# Patient Record
Sex: Male | Born: 1960 | Hispanic: No | Marital: Married | Smoking: Never smoker
Health system: Southern US, Community
[De-identification: ages and names within clinical notes are randomized; demographics above are authoritative.]

---

## 2003-05-24 ENCOUNTER — Emergency Department (HOSPITAL_COMMUNITY): Admission: EM | Admit: 2003-05-24 | Discharge: 2003-05-24 | Payer: Self-pay | Admitting: Emergency Medicine

## 2010-06-21 ENCOUNTER — Emergency Department (HOSPITAL_COMMUNITY): Admission: EM | Admit: 2010-06-21 | Discharge: 2010-06-21 | Payer: Self-pay | Admitting: Emergency Medicine

## 2011-02-02 LAB — CBC
MCV: 89.8 fL (ref 78.0–100.0)
Platelets: 265 10*3/uL (ref 150–400)

## 2011-02-02 LAB — DIFFERENTIAL
Basophils Relative: 0 % (ref 0–1)
Eosinophils Absolute: 0 10*3/uL (ref 0.0–0.7)
Monocytes Relative: 10 % (ref 3–12)
Neutro Abs: 1.7 10*3/uL (ref 1.7–7.7)
Neutrophils Relative %: 47 % (ref 43–77)

## 2011-02-02 LAB — POCT I-STAT, CHEM 8
Calcium, Ion: 1.21 mmol/L (ref 1.12–1.32)
Creatinine, Ser: 0.9 mg/dL (ref 0.4–1.5)
Glucose, Bld: 114 mg/dL — ABNORMAL HIGH (ref 70–99)
Hemoglobin: 15.6 g/dL (ref 13.0–17.0)
Potassium: 3.9 mEq/L (ref 3.5–5.1)

## 2011-02-02 LAB — POCT CARDIAC MARKERS: Myoglobin, poc: 42.3 ng/mL (ref 12–200)

## 2013-05-05 ENCOUNTER — Emergency Department (HOSPITAL_COMMUNITY)
Admission: EM | Admit: 2013-05-05 | Discharge: 2013-05-05 | Disposition: A | Discharge status: Home / Self Care | Payer: BC Managed Care – PPO | Attending: Emergency Medicine | Admitting: Emergency Medicine

## 2013-05-05 ENCOUNTER — Emergency Department (HOSPITAL_COMMUNITY): Payer: BC Managed Care – PPO

## 2013-05-05 ENCOUNTER — Encounter (HOSPITAL_COMMUNITY): Payer: Self-pay | Admitting: *Deleted

## 2013-05-05 DIAGNOSIS — R079 Chest pain, unspecified: Secondary | ICD-10-CM

## 2013-05-05 DIAGNOSIS — R0789 Other chest pain: Secondary | ICD-10-CM | POA: Insufficient documentation

## 2013-05-05 DIAGNOSIS — R42 Dizziness and giddiness: Secondary | ICD-10-CM | POA: Insufficient documentation

## 2013-05-05 DIAGNOSIS — R61 Generalized hyperhidrosis: Secondary | ICD-10-CM | POA: Insufficient documentation

## 2013-05-05 DIAGNOSIS — R0989 Other specified symptoms and signs involving the circulatory and respiratory systems: Secondary | ICD-10-CM | POA: Insufficient documentation

## 2013-05-05 DIAGNOSIS — Z7982 Long term (current) use of aspirin: Secondary | ICD-10-CM | POA: Insufficient documentation

## 2013-05-05 DIAGNOSIS — R0609 Other forms of dyspnea: Secondary | ICD-10-CM | POA: Insufficient documentation

## 2013-05-05 LAB — CBC WITH DIFFERENTIAL/PLATELET
Basophils Relative: 0 % (ref 0–1)
Lymphocytes Relative: 53 % — ABNORMAL HIGH (ref 12–46)
Lymphs Abs: 2 10*3/uL (ref 0.7–4.0)
MCHC: 35 g/dL (ref 30.0–36.0)
Monocytes Relative: 12 % (ref 3–12)
Platelets: 232 10*3/uL (ref 150–400)
RDW: 12.3 % (ref 11.5–15.5)

## 2013-05-05 LAB — COMPREHENSIVE METABOLIC PANEL
ALT: 22 U/L (ref 0–53)
Albumin: 3.9 g/dL (ref 3.5–5.2)
CO2: 29 mEq/L (ref 19–32)
Calcium: 9.5 mg/dL (ref 8.4–10.5)
Sodium: 137 mEq/L (ref 135–145)
Total Bilirubin: 0.3 mg/dL (ref 0.3–1.2)
Total Protein: 7.2 g/dL (ref 6.0–8.3)

## 2013-05-05 LAB — POCT I-STAT TROPONIN I: Troponin i, poc: 0.01 ng/mL (ref 0.00–0.08)

## 2013-05-05 NOTE — ED Notes (Signed)
States having chest pain all day worse now; left sided pain; little bit of shortness of breath

## 2013-05-05 NOTE — ED Provider Notes (Signed)
History     CSN: [id number]  Arrival date & time 05/05/13  2057   First MD Initiated Contact with Patient 05/05/13 2109      Chief Complaint  Patient presents with  . Chest Pain    (Consider location/radiation/quality/duration/timing/severity/associated sxs/prior treatment) Patient is a 52 y.o. male presenting with chest pain. The history is provided by the patient.  Chest Pain  Patient here complaining of a one-day history of intermittent chest pain lasting minutes to hours located in left chest radiating to his left arm. Symptoms have not been associated with dyspnea, diaphoresis, dizziness. No exertional component to it. Had similar symptoms yesterday which resolved. Has been taking aspirin which do seem to help. Denies any fever or cough. No rashes to his chest. No prior history of same. Denies any history of coronary artery disease. Recent trauma history or leg pain or swelling. He is currently pain-free at this time History reviewed. No pertinent past medical history.  History reviewed. No pertinent past surgical history.  No family history on file.  History  Substance Use Topics  . Smoking status: Never Smoker   . Smokeless tobacco: Not on file  . Alcohol Use: No      Review of Systems  Cardiovascular: Positive for chest pain.  All other systems reviewed and are negative.    Allergies  Review of patient's allergies indicates no known allergies.  Home Medications   Current Outpatient Rx  Name  Route  Sig  Dispense  Refill  . aspirin EC 81 MG tablet   Oral   Take 81 mg by mouth daily.           BP 125/74  Pulse 88  Temp(Src) 98.8 F (37.1 C) (Oral)  Resp 18  SpO2 95%  Physical Exam  Nursing note and vitals reviewed. Constitutional: He is oriented to person, place, and time. He appears well-developed and well-nourished.  Non-toxic appearance. No distress.  HENT:  Head: Normocephalic and atraumatic.  Eyes: Conjunctivae, EOM and lids are normal.  Pupils are equal, round, and reactive to light.  Neck: Normal range of motion. Neck supple. No tracheal deviation present. No mass present.  Cardiovascular: Normal rate, regular rhythm and normal heart sounds.  Exam reveals no gallop.   No murmur heard. Pulmonary/Chest: Effort normal and breath sounds normal. No stridor. No respiratory distress. He has no decreased breath sounds. He has no wheezes. He has no rhonchi. He has no rales.  Abdominal: Soft. Normal appearance and bowel sounds are normal. He exhibits no distension. There is no tenderness. There is no rebound and no CVA tenderness.  Musculoskeletal: Normal range of motion. He exhibits no edema and no tenderness.  Neurological: He is alert and oriented to person, place, and time. He has normal strength. No cranial nerve deficit or sensory deficit. GCS eye subscore is 4. GCS verbal subscore is 5. GCS motor subscore is 6.  Skin: Skin is warm and dry. No abrasion and no rash noted.  Psychiatric: He has a normal mood and affect. His speech is normal and behavior is normal.    ED Course  Procedures (including critical care time)  Labs Reviewed  CBC WITH DIFFERENTIAL  COMPREHENSIVE METABOLIC PANEL   No results found.   No diagnosis found.    MDM   Date: 05/05/2013  Rate: 89  Rhythm: normal sinus rhythm  QRS Axis: normal  Intervals: normal  ST/T Wave abnormalities: normal  Conduction Disutrbances:none  Narrative Interpretation:   Old EKG Reviewed: none available  10:41 PM Patient offered admission for evaluation of his chest pain. He has deferred at this time and the risk of sudden cardiac death was explained to him and he accepts this. He was instructed to continue taking aspirin every day and will be given cardiology referral and was encouraged to return if he changes his mind        Toy Baker, MD 05/05/13 2242

## 2013-05-06 ENCOUNTER — Other Ambulatory Visit (HOSPITAL_COMMUNITY): Payer: Self-pay | Admitting: Cardiology

## 2013-05-06 DIAGNOSIS — R079 Chest pain, unspecified: Secondary | ICD-10-CM

## 2013-05-12 ENCOUNTER — Ambulatory Visit (HOSPITAL_COMMUNITY)
Admission: RE | Admit: 2013-05-12 | Discharge: 2013-05-12 | Disposition: A | Discharge status: Home / Self Care | Payer: BC Managed Care – PPO | Source: Ambulatory Visit | Attending: Cardiology | Admitting: Cardiology

## 2013-05-12 ENCOUNTER — Encounter (HOSPITAL_COMMUNITY)
Admission: RE | Disposition: A | Discharge status: Home / Self Care | Payer: Self-pay | Source: Ambulatory Visit | Attending: Cardiology

## 2013-05-12 DIAGNOSIS — Z7982 Long term (current) use of aspirin: Secondary | ICD-10-CM | POA: Insufficient documentation

## 2013-05-12 DIAGNOSIS — E7800 Pure hypercholesterolemia, unspecified: Secondary | ICD-10-CM | POA: Insufficient documentation

## 2013-05-12 DIAGNOSIS — Z8249 Family history of ischemic heart disease and other diseases of the circulatory system: Secondary | ICD-10-CM | POA: Insufficient documentation

## 2013-05-12 DIAGNOSIS — R079 Chest pain, unspecified: Secondary | ICD-10-CM | POA: Insufficient documentation

## 2013-05-12 DIAGNOSIS — Z79899 Other long term (current) drug therapy: Secondary | ICD-10-CM | POA: Insufficient documentation

## 2013-05-12 HISTORY — PX: LEFT HEART CATHETERIZATION WITH CORONARY ANGIOGRAM: SHX5451

## 2013-05-12 SURGERY — LEFT HEART CATHETERIZATION WITH CORONARY ANGIOGRAM
Anesthesia: LOCAL

## 2013-05-12 MED ORDER — SODIUM CHLORIDE 0.9 % IJ SOLN: 3.0000 mL | Freq: Two times a day (BID) | INTRAMUSCULAR | Status: DC

## 2013-05-12 MED ORDER — LIDOCAINE HCL (PF) 1 % IJ SOLN
INTRAMUSCULAR | Status: AC
  Filled 2013-05-12: qty 30

## 2013-05-12 MED ORDER — NITROGLYCERIN 0.2 MG/ML ON CALL CATH LAB
INTRAVENOUS | Status: AC
  Filled 2013-05-12: qty 1

## 2013-05-12 MED ORDER — SODIUM CHLORIDE 0.9 % IV SOLN
INTRAVENOUS | Status: DC
  Administered 2013-05-12: 09:00:00 via INTRAVENOUS

## 2013-05-12 MED ORDER — ASPIRIN 81 MG PO CHEW
CHEWABLE_TABLET | ORAL | Status: AC
  Administered 2013-05-12: 324 mg via ORAL
  Filled 2013-05-12: qty 4

## 2013-05-12 MED ORDER — ASPIRIN 81 MG PO CHEW
324.0000 mg | CHEWABLE_TABLET | ORAL | Status: AC
  Administered 2013-05-12: 324 mg via ORAL

## 2013-05-12 MED ORDER — FENTANYL CITRATE 0.05 MG/ML IJ SOLN
INTRAMUSCULAR | Status: AC
  Filled 2013-05-12: qty 2

## 2013-05-12 MED ORDER — SODIUM CHLORIDE 0.9 % IV SOLN: 250.0000 mL | INTRAVENOUS | Status: DC | PRN

## 2013-05-12 MED ORDER — SODIUM CHLORIDE 0.9 % IV SOLN: INTRAVENOUS | Status: AC

## 2013-05-12 MED ORDER — ACETAMINOPHEN 325 MG PO TABS: 650.0000 mg | ORAL_TABLET | ORAL | Status: DC | PRN

## 2013-05-12 MED ORDER — SODIUM CHLORIDE 0.9 % IJ SOLN: 3.0000 mL | INTRAMUSCULAR | Status: DC | PRN

## 2013-05-12 MED ORDER — MIDAZOLAM HCL 2 MG/2ML IJ SOLN
INTRAMUSCULAR | Status: AC
  Filled 2013-05-12: qty 2

## 2013-05-12 MED ORDER — HEPARIN (PORCINE) IN NACL 2-0.9 UNIT/ML-% IJ SOLN
INTRAMUSCULAR | Status: AC
  Filled 2013-05-12: qty 1000

## 2013-05-12 MED ORDER — ONDANSETRON HCL 4 MG/2ML IJ SOLN: 4.0000 mg | Freq: Four times a day (QID) | INTRAMUSCULAR | Status: DC | PRN

## 2013-05-12 NOTE — H&P (Signed)
H&P in the chart needs to be scanned 

## 2013-05-12 NOTE — Cardiovascular Report (Signed)
NAMESHAMARION, COOTS NO.:  [id number]  MEDICAL RECORD NO.:  [id number]  LOCATION:  [facility]                         FACILITY:  [facility]  PHYSICIAN:  Therin Vetsch N. Sharyn Lull, M.D. DATE OF BIRTH:  16-Oct-1961  DATE OF PROCEDURE:  05/12/2013 DATE OF DISCHARGE:                           CARDIAC CATHETERIZATION   PROCEDURE:  Left cardiac cath with selective left and right coronary angiography, LV graphy via right groin using Judkins technique.  INDICATION FOR THE PROCEDURE:  Howell is a 52 year old male with no significant past medical history, complains of retrosternal and left- sided chest pain described as pressure, squeezing, radiating to left shoulder and left arm associated with exertion, relieves with rest.  The patient denies any nausea, vomiting, diaphoresis.  Denies palpitation, lightheadedness, or syncope.  Denies PND, orthopnea, or leg swelling. Denies relation of chest pain to food, breathing, or movement.  Denies rest or nocturnal angina.  The patient was seen at [facility], workup was negative, and referred for further evaluation and management. Discussed with the patient at length regarding noninvasive stress testing versus left cath, possible PTCA stenting.  Initially, the patient agreed for stress testing, but subsequently refused to go for stress testing and wanted to proceed with invasive left cath possible PTCA stenting.  The patient also has a significant family history of coronary artery disease.  Father died in his 73s due to massive MI and so due to typical anginal chest pain and strong family history of heart disease, the patient was consented for invasive procedure as above.  PROCEDURE:  After obtaining the informed consent, the patient was brought to the cath lab and was placed on fluoroscopy table.  Right groin was prepped and draped in usual fashion.  1% Xylocaine was used for local anesthesia in the right groin.  With the help of thin  wall needle, 5-French arterial sheath was placed.  The sheath was aspirated and flushed.  Next, 5-French left Judkins catheter was advanced over the wire under fluoroscopic guidance up to the ascending aorta.  Wire was pulled out, the catheter was aspirated and connected to the Manifold. Catheter was further advanced and engaged into the left coronary ostium. Multiple views of the left system were taken.  Next, catheter was disengaged and was pulled out over the wire and was replaced with 5- [place] right Judkins catheter, which was advanced over the wire under fluoroscopic guidance up to the ascending aorta.  Wire was pulled out, the catheter was aspirated and connected to the Manifold.  Catheter was further advanced and engaged into right coronary ostium.  Multiple views of the right system were obtained.  Next, catheter was disengaged and was pulled out over the wire and was replaced with 5-French pigtail catheter, which was advanced over the wire under fluoroscopic guidance up to the ascending aorta.  Catheter was further advanced across the aortic valve into the LV.  LV pressures were recorded.  Next, LV graft was done in 30-degree RAO position.  Post-angiographic pressures were recorded from LV and then pullback pressures were recorded from the aorta.  There was minimal gradient across the aortic valve.  Next, the pigtail catheter was pulled out over the wire.  Sheaths were aspirated and flushed.  FINDINGS:  LV showed good LV systolic function.  EF of 55%-60%.  Left main was short, which was patent.  LAD was patent.  Diagonal 1 was small, which was patent.  Diagonal 2 was very small, which was patent. Ramus was very very small.  Left circumflex 5%-10% mid stenosis.  OM1 and OM2 were patent.  RCA was patent.  PDA and PLV branches were patent.  The patient tolerated the procedure well.  There were no complications.  The patient was transferred to recovery room in stable  condition.     Eduardo Osier. Sharyn Lull, M.D.     [facility]/MEDQ  D:  05/12/2013  T:  05/12/2013  Job:  [id number]

## 2013-05-12 NOTE — CV Procedure (Signed)
Left cardiac cath report dictated on 05/12/2013 dictation number is [phone]

## 2013-05-13 ENCOUNTER — Encounter (HOSPITAL_COMMUNITY): Payer: BC Managed Care – PPO

## 2014-10-28 ENCOUNTER — Encounter (HOSPITAL_COMMUNITY): Payer: Self-pay | Admitting: Cardiology

## 2015-06-19 ENCOUNTER — Encounter (HOSPITAL_COMMUNITY): Payer: Self-pay | Admitting: Emergency Medicine

## 2015-06-19 ENCOUNTER — Emergency Department (HOSPITAL_COMMUNITY): Payer: BC Managed Care – PPO

## 2015-06-19 ENCOUNTER — Emergency Department (HOSPITAL_COMMUNITY)
Admission: EM | Admit: 2015-06-19 | Discharge: 2015-06-19 | Disposition: A | Discharge status: Home / Self Care | Payer: BC Managed Care – PPO | Attending: Emergency Medicine | Admitting: Emergency Medicine

## 2015-06-19 DIAGNOSIS — R1031 Right lower quadrant pain: Secondary | ICD-10-CM

## 2015-06-19 DIAGNOSIS — Z9889 Other specified postprocedural states: Secondary | ICD-10-CM | POA: Diagnosis not present

## 2015-06-19 DIAGNOSIS — Z7982 Long term (current) use of aspirin: Secondary | ICD-10-CM | POA: Insufficient documentation

## 2015-06-19 DIAGNOSIS — K5732 Diverticulitis of large intestine without perforation or abscess without bleeding: Secondary | ICD-10-CM | POA: Insufficient documentation

## 2015-06-19 DIAGNOSIS — R1030 Lower abdominal pain, unspecified: Secondary | ICD-10-CM | POA: Diagnosis present

## 2015-06-19 LAB — URINALYSIS, ROUTINE W REFLEX MICROSCOPIC
BILIRUBIN URINE: NEGATIVE
GLUCOSE, UA: NEGATIVE mg/dL
HGB URINE DIPSTICK: NEGATIVE
Ketones, ur: NEGATIVE mg/dL
Leukocytes, UA: NEGATIVE
Nitrite: NEGATIVE
PROTEIN: NEGATIVE mg/dL
Specific Gravity, Urine: 1.017 (ref 1.005–1.030)
UROBILINOGEN UA: 0.2 mg/dL (ref 0.0–1.0)
pH: 7 (ref 5.0–8.0)

## 2015-06-19 LAB — CBC WITH DIFFERENTIAL/PLATELET
BASOS ABS: 0 10*3/uL (ref 0.0–0.1)
Basophils Relative: 0 % (ref 0–1)
EOS ABS: 0 10*3/uL (ref 0.0–0.7)
EOS PCT: 0 % (ref 0–5)
HCT: 43.6 % (ref 39.0–52.0)
Hemoglobin: 15.4 g/dL (ref 13.0–17.0)
LYMPHS PCT: 17 % (ref 12–46)
Lymphs Abs: 1.3 10*3/uL (ref 0.7–4.0)
MCH: 30.2 pg (ref 26.0–34.0)
MCHC: 35.3 g/dL (ref 30.0–36.0)
MCV: 85.5 fL (ref 78.0–100.0)
Monocytes Absolute: 0.8 10*3/uL (ref 0.1–1.0)
Monocytes Relative: 10 % (ref 3–12)
NEUTROS PCT: 73 % (ref 43–77)
Neutro Abs: 5.6 10*3/uL (ref 1.7–7.7)
PLATELETS: 221 10*3/uL (ref 150–400)
RBC: 5.1 MIL/uL (ref 4.22–5.81)
RDW: 12.5 % (ref 11.5–15.5)
WBC: 7.6 10*3/uL (ref 4.0–10.5)

## 2015-06-19 LAB — BASIC METABOLIC PANEL
ANION GAP: 10 (ref 5–15)
BUN: 10 mg/dL (ref 6–20)
CHLORIDE: 103 mmol/L (ref 101–111)
CO2: 26 mmol/L (ref 22–32)
CREATININE: 1.01 mg/dL (ref 0.61–1.24)
Calcium: 9 mg/dL (ref 8.9–10.3)
GFR calc Af Amer: >60 mL/min (ref >60)
GFR calc non Af Amer: >60 mL/min (ref >60)
Glucose, Bld: 111 mg/dL — ABNORMAL HIGH (ref 65–99)
Potassium: 3.8 mmol/L (ref 3.5–5.1)
Sodium: 139 mmol/L (ref 135–145)

## 2015-06-19 MED ORDER — HYDROCODONE-ACETAMINOPHEN 5-325 MG PO TABS: 2.0000 | ORAL_TABLET | ORAL | Status: DC | PRN

## 2015-06-19 MED ORDER — AMOXICILLIN-POT CLAVULANATE 875-125 MG PO TABS: 1.0000 | ORAL_TABLET | Freq: Two times a day (BID) | ORAL | Status: DC

## 2015-06-19 MED ORDER — IOHEXOL 300 MG/ML  SOLN
100.0000 mL | Freq: Once | INTRAMUSCULAR | Status: AC | PRN
  Administered 2015-06-19: 100 mL via INTRAVENOUS

## 2015-06-19 MED ORDER — IOHEXOL 300 MG/ML  SOLN: 50.0000 mL | Freq: Once | INTRAMUSCULAR | Status: DC | PRN

## 2015-06-19 NOTE — Discharge Instructions (Signed)

## 2015-06-19 NOTE — ED Notes (Signed)
Pt aware urine sample is needed.  Drinking po contrast.

## 2015-06-19 NOTE — ED Provider Notes (Signed)
CSN: [id number]     Arrival date & time 06/19/15  1001 History   First MD Initiated Contact with Patient 06/19/15 1016     Chief Complaint  Patient presents with  . Abdominal Pain  . Constipation     HPI Patient presents with lower abdominal pain began Friday.  Denies nausea vomiting or diarrhea.  Denies urgency frequency.  Denies fever chills.  Patient had a colonoscopy done a week or so ago. History reviewed. No pertinent past medical history. Past Surgical History  Procedure Laterality Date  . Left heart catheterization with coronary angiogram N/A 05/12/2013    Procedure: LEFT HEART CATHETERIZATION WITH CORONARY ANGIOGRAM;  Surgeon: Robynn Pane, MD;  Location: [facility] CATH LAB;  Service: Cardiovascular;  Laterality: N/A;   History reviewed. No pertinent family history. History  Substance Use Topics  . Smoking status: Never Smoker   . Smokeless tobacco: Not on file  . Alcohol Use: No    Review of Systems    Allergies  Review of patient's allergies indicates no known allergies.  Home Medications   Prior to Admission medications   Medication Sig Start Date End Date Taking? Authorizing Provider  aspirin EC 81 MG tablet Take 81 mg by mouth daily.   Yes Historical Provider, MD  amoxicillin-clavulanate (AUGMENTIN) 875-125 MG per tablet Take 1 tablet by mouth every 12 (twelve) hours. 06/19/15   Nelva Nay, MD  HYDROcodone-acetaminophen (NORCO/VICODIN) 5-325 MG per tablet Take 2 tablets by mouth every 4 (four) hours as needed. 06/19/15   Nelva Nay, MD   BP 116/74 mmHg  Pulse 85  Temp(Src) 97.2 F (36.2 C) (Oral)  Resp 16  SpO2 98% Physical Exam Physical Exam  Nursing note and vitals reviewed. Constitutional: He is oriented to person, place, and time. He appears well-developed and well-nourished. No distress.  HENT:  Head: Normocephalic and atraumatic.  Eyes: Pupils are equal, round, and reactive to light.  Neck: Normal range of motion.  Cardiovascular: Normal rate  and intact distal pulses.   Pulmonary/Chest: No respiratory distress.  Abdominal: Normal appearance. He exhibits no distension.  Tenderness to palpation in the right lower quadrant and left lower quadrant.  No rebound or guarding tenderness.  Bowel sounds are active.  Small umbilical hernia which is easily reducible noted.   Musculoskeletal: Normal range of motion.  Neurological: He is alert and oriented to person, place, and time. No cranial nerve deficit.  Skin: Skin is warm and dry. No rash noted.  Psychiatric: He has a normal mood and affect. His behavior is normal.   ED Course  Procedures (including critical care time) Labs Review Labs Reviewed  BASIC METABOLIC PANEL - Abnormal; Notable for the following:    Glucose, Bld 111 (*)    All other components within normal limits  CBC WITH DIFFERENTIAL/PLATELET  URINALYSIS, ROUTINE W REFLEX MICROSCOPIC (NOT AT [facility])    Imaging Review Ct Abdomen Pelvis W Contrast  06/19/2015   CLINICAL DATA:  Right lower medial abdominal pain since Friday.  EXAM: CT ABDOMEN AND PELVIS WITH CONTRAST  TECHNIQUE: Multidetector CT imaging of the abdomen and pelvis was performed using the standard protocol following bolus administration of intravenous contrast.  CONTRAST:  OMNIPAQUE IOHEXOL 300 MG/ML  SOLN  COMPARISON:  None.  FINDINGS: Scattered colonic diverticulosis with a short segment (approximately 10 cm) of geographic wall thickening involving the sigmoid colon within the left lower abdomen / upper pelvis with associated adjacent mesenteric stranding (representative axial image 57, series 2; coronal image 34,  series 4) favored to represent area of acute uncomplicated diverticulitis. There is a very small amount of free fluid seen within the caudal aspect of the right pericolic gutter (image 58, series 2), favored to be reactive. No evidence of perforation or definable/drainable fluid collection.  Enteric contrast extends to the level of the hepatic flexure  of the colon. The bowel is otherwise normal in course and caliber without wall thickening. Normal noncontrast appearance of the retrocecal appendix. No pneumoperitoneum, pneumatosis or portal venous gas.  Normal hepatic contour. Punctate sub cm hypo attenuating lesion within the dome of the right lobe of the liver (image 5, series 2), is too small likely characterize of favored to represent a hepatic cyst. Normal appearance of the gallbladder. No radiopaque gallstones. No intra extrahepatic biliary dilatation. No ascites.  There is symmetric enhancement and excretion of the bilateral kidneys. Note is made of an approximately 2 cm hypo attenuating nonenhancing left-sided renal cyst (image 16, series 3). No discrete right-sided renal lesions. No definite renal stones on this postcontrast examination. No urinary obstruction or perinephric stranding. Normal appearance of the bilateral adrenal glands, pancreas and spleen.  Normal caliber of the abdominal aorta. The major branch vessels of the abdominal aorta appear widely patent on this non CTA examination.  No bulky retroperitoneal, mesenteric, pelvic or inguinal lymphadenopathy.  Normal appearance of the urinary bladder given degree distention. Normal appearance of the pelvic organs. A prominent phleboliths is seen within the right lower hemipelvis. No free fluid in the pelvic cul-de-sac.  Limited visualization of the lower thorax demonstrates minimal dependent subpleural ground-glass atelectasis. No discrete focal airspace opacities. No pleural effusion.  Normal heart size.  No pericardial effusion.  No acute or aggressive osseous abnormalities. Note is made of partial lumbarization of the S1 vertebral body.  Small mesenteric fat containing periumbilical and bilateral inguinal hernias. Regional soft tissues appear otherwise normal.    IMPRESSION: Examination is positive for acute uncomplicated diverticulitis involving an approximately 10 cm length segment of the  sigmoid colon within the left lower abdomen/upper pelvis. No evidence of perforation or definable/drainable fluid collection. No evidence of enteric obstruction. If not recently performed, further evaluation with colonoscopy after the resolution of acute symptoms is recommended to exclude the presence of an underlying mass.     Electronically Signed   By: Simonne Come M.D.   On: 06/19/2015 12:20      MDM   Final diagnoses:  RLQ abdominal pain  Diverticulitis of large intestine without perforation or abscess without bleeding        Nelva Nay, MD 06/19/15 1344

## 2015-06-19 NOTE — ED Notes (Signed)
Pt c/o lower medial abdominal pain onset Friday, worsened last night. Denies nausea, emesis, diarrhea, or urinary symptoms.

## 2015-06-19 NOTE — ED Notes (Signed)
Pt given discharge instructions, verbalized understanding

## 2016-11-23 ENCOUNTER — Encounter (HOSPITAL_COMMUNITY): Payer: Self-pay

## 2016-11-23 DIAGNOSIS — Z7982 Long term (current) use of aspirin: Secondary | ICD-10-CM | POA: Insufficient documentation

## 2016-11-23 DIAGNOSIS — R197 Diarrhea, unspecified: Secondary | ICD-10-CM | POA: Insufficient documentation

## 2016-11-23 DIAGNOSIS — Z79899 Other long term (current) drug therapy: Secondary | ICD-10-CM | POA: Insufficient documentation

## 2016-11-23 DIAGNOSIS — R112 Nausea with vomiting, unspecified: Secondary | ICD-10-CM | POA: Diagnosis present

## 2016-11-23 LAB — COMPREHENSIVE METABOLIC PANEL
ALBUMIN: 3.9 g/dL (ref 3.5–5.0)
ALK PHOS: 70 U/L (ref 38–126)
ALT: 26 U/L (ref 17–63)
AST: 26 U/L (ref 15–41)
Anion gap: 8 (ref 5–15)
BILIRUBIN TOTAL: 0.8 mg/dL (ref 0.3–1.2)
BUN: 13 mg/dL (ref 6–20)
CALCIUM: 8.6 mg/dL — AB (ref 8.9–10.3)
CO2: 25 mmol/L (ref 22–32)
Chloride: 102 mmol/L (ref 101–111)
Creatinine, Ser: 0.83 mg/dL (ref 0.61–1.24)
GFR calc Af Amer: >60 mL/min (ref >60)
GFR calc non Af Amer: >60 mL/min (ref >60)
GLUCOSE: 119 mg/dL — AB (ref 65–99)
Potassium: 3.5 mmol/L (ref 3.5–5.1)
SODIUM: 135 mmol/L (ref 135–145)
TOTAL PROTEIN: 7.2 g/dL (ref 6.5–8.1)

## 2016-11-23 LAB — URINALYSIS, ROUTINE W REFLEX MICROSCOPIC
BILIRUBIN URINE: NEGATIVE
GLUCOSE, UA: NEGATIVE mg/dL
HGB URINE DIPSTICK: NEGATIVE
KETONES UR: NEGATIVE mg/dL
Leukocytes, UA: NEGATIVE
NITRITE: NEGATIVE
Protein, ur: 30 mg/dL — AB
SPECIFIC GRAVITY, URINE: 1.026 (ref 1.005–1.030)
Squamous Epithelial / LPF: NONE SEEN
pH: 5 (ref 5.0–8.0)

## 2016-11-23 LAB — CBC
HCT: 43.6 % (ref 39.0–52.0)
HEMOGLOBIN: 15.2 g/dL (ref 13.0–17.0)
MCH: 30 pg (ref 26.0–34.0)
MCHC: 34.9 g/dL (ref 30.0–36.0)
MCV: 86.2 fL (ref 78.0–100.0)
Platelets: 190 10*3/uL (ref 150–400)
RBC: 5.06 MIL/uL (ref 4.22–5.81)
RDW: 12.7 % (ref 11.5–15.5)
WBC: 3 10*3/uL — ABNORMAL LOW (ref 4.0–10.5)

## 2016-11-23 LAB — LIPASE, BLOOD: Lipase: 18 U/L (ref 11–51)

## 2016-11-23 NOTE — ED Triage Notes (Signed)
Pt states that he thinks he may have food poisoning. He is complaining of N/V/D/F starting last night. He states that he recently ate some meat that he think caused it. Denies other sickness in the home. A&Ox4. Ambulatory.

## 2016-11-24 ENCOUNTER — Emergency Department (HOSPITAL_COMMUNITY)
Admission: EM | Admit: 2016-11-24 | Discharge: 2016-11-24 | Disposition: A | Discharge status: Home / Self Care | Payer: BC Managed Care – PPO | Attending: Emergency Medicine | Admitting: Emergency Medicine

## 2016-11-24 DIAGNOSIS — R197 Diarrhea, unspecified: Secondary | ICD-10-CM

## 2016-11-24 DIAGNOSIS — R112 Nausea with vomiting, unspecified: Secondary | ICD-10-CM

## 2016-11-24 MED ORDER — ONDANSETRON HCL 4 MG PO TABS: 4.0000 mg | ORAL_TABLET | Freq: Four times a day (QID) | ORAL | 0 refills | Status: DC

## 2016-11-24 MED ORDER — ONDANSETRON 4 MG PO TBDP
4.0000 mg | ORAL_TABLET | Freq: Once | ORAL | Status: AC | PRN
  Administered 2016-11-24: 4 mg via ORAL
  Filled 2016-11-24: qty 1

## 2016-11-24 MED ORDER — DIPHENOXYLATE-ATROPINE 2.5-0.025 MG PO TABS: 1.0000 | ORAL_TABLET | Freq: Four times a day (QID) | ORAL | 0 refills | Status: DC | PRN

## 2016-11-24 NOTE — ED Provider Notes (Signed)
[facility]-EMERGENCY DEPT Provider Note   CSN: [id number] Arrival date & time: 11/23/16  2203   By signing my name below, I, Clovis Pu, attest that this documentation has been prepared under the direction and in the presence of Gilda Crease, MD  Electronically Signed: Clovis Pu, ED Scribe. 11/24/16. 2:23 AM.   History   Chief Complaint Chief Complaint  Patient presents with  . Nausea  . Emesis  . Diarrhea    The history is provided by the patient. No language interpreter was used.   HPI Comments:  Glenn Kirby is a 56 y.o. male who presents to the Emergency Department complaining of sudden onset, gradually improving nausea, intermittent vomiting and intermittent diarrhea x 2 night ago. Pt also reports associated, mild abdominal discomfort and chills. Pt was given Zofran in the ED with relief. Pt denies cough, cold-like symptoms, any other associated symptoms and any other modifying factors at this time.    No past medical history on file.  There are no active problems to display for this patient.   Past Surgical History:  Procedure Laterality Date  . LEFT HEART CATHETERIZATION WITH CORONARY ANGIOGRAM N/A 05/12/2013   Procedure: LEFT HEART CATHETERIZATION WITH CORONARY ANGIOGRAM;  Surgeon: Robynn Pane, MD;  Location[facility] C[facility] LAB;  Service: Cardiovascular;  Laterality: N/A;    Home Medications    Prior to Admission medications   Medication Sig Start Date End Date Taking? Authorizing Provider  amoxicillin-clavulanate (AUGMENTIN) 875-125 MG per tablet Take 1 tablet by mouth every 12 (twelve) hours. 06/19/15   Nelva Nay, MD  aspirin EC 81 MG tablet Take 81 mg by mouth daily.    Historical Provider, MD  diphenoxylate-atropine (LOMOTIL) 2.5-0.025 MG tablet Take 1 tablet by mouth 4 (four) times daily as needed for diarrhea or loose stools. 11/24/16   Gilda Crease, MD  HYDROcodone-acetaminophen (NORCO/VICODIN) 5-325 MG per tablet Take 2 tablets by mouth every  4 (four) hours as needed. 06/19/15   Nelva Nay, MD  ondansetron (ZOFRAN) 4 MG tablet Take 1 tablet (4 mg total) by mouth every 6 (six) hours. 11/24/16   Gilda Crease, MD    Family History No family history on file.  Social History Social History  Substance Use Topics  . Smoking status: Never Smoker  . Smokeless tobacco: Never Used  . Alcohol use No     Allergies   Patient has no known allergies.   Review of Systems Review of Systems  Constitutional: Positive for chills.  Respiratory: Negative for cough.   Gastrointestinal: Positive for abdominal pain, diarrhea, nausea and vomiting.  All other systems reviewed and are negative.  Physical Exam Updated Vital Signs BP 134/87 (BP Location: Right Arm)   Pulse 101   Temp 98.5 F (36.9 C) (Oral)   Resp 20   Ht 5\' 8"  (1.727 m)   Wt 185 lb (83.9 kg)   SpO2 94%   BMI 28.13 kg/m   Physical Exam  Constitutional: He is oriented to person, place, and time. He appears well-developed and well-nourished. No distress.  HENT:  Head: Normocephalic and atraumatic.  Right Ear: Hearing normal.  Left Ear: Hearing normal.  Nose: Nose normal.  Mouth/Throat: Oropharynx is clear and moist and mucous membranes are normal.  Eyes: Conjunctivae and EOM are normal. Pupils are equal, round, and reactive to light.  Neck: Normal range of motion. Neck supple.  Cardiovascular: Regular rhythm, S1 normal and S2 normal.  Exam reveals no gallop and no friction rub.  No murmur heard. Pulmonary/Chest: Effort normal and breath sounds normal. No respiratory distress. He exhibits no tenderness.  Abdominal: Soft. Normal appearance and bowel sounds are normal. There is no hepatosplenomegaly. There is no tenderness. There is no rebound, no guarding, no tenderness at McBurney's point and negative Murphy's sign. No hernia.  Musculoskeletal: Normal range of motion.  Neurological: He is alert and oriented to person, place, and time. He has normal  strength. No cranial nerve deficit or sensory deficit. Coordination normal. GCS eye subscore is 4. GCS verbal subscore is 5. GCS motor subscore is 6.  Skin: Skin is warm, dry and intact. No rash noted. No cyanosis.  Psychiatric: He has a normal mood and affect. His speech is normal and behavior is normal. Thought content normal.  Nursing note and vitals reviewed.    ED Treatments / Results  DIAGNOSTIC STUDIES:  Oxygen Saturation is 94% on RA, normal by my interpretation.    COORDINATION OF CARE:  2:22 AM Discussed treatment plan with pt at bedside and pt agreed to plan.  Labs (all labs ordered are listed, but only abnormal results are displayed) Labs Reviewed  COMPREHENSIVE METABOLIC PANEL - Abnormal; Notable for the following:       Result Value   Glucose, Bld 119 (*)    Calcium 8.6 (*)    All other components within normal limits  CBC - Abnormal; Notable for the following:    WBC 3.0 (*)    All other components within normal limits  URINALYSIS, ROUTINE W REFLEX MICROSCOPIC - Abnormal; Notable for the following:    APPearance HAZY (*)    Protein, ur 30 (*)    Bacteria, UA RARE (*)    All other components within normal limits  LIPASE, BLOOD    EKG  EKG Interpretation None       Radiology No results found.  Procedures Procedures (including critical care time)  Medications Ordered in ED Medications  ondansetron (ZOFRAN-ODT) disintegrating tablet 4 mg (4 mg Oral Given 11/24/16 0216)     Initial Impression / Assessment and Plan / ED Course  I have reviewed the triage vital signs and the nursing notes.  Pertinent labs & imaging results that were available during my care of the patient were reviewed by me and considered in my medical decision making (see chart for details).  Clinical Course    Patient presents to the emergency department for evaluation of nausea, vomiting, diarrhea. Symptoms present for 1 day. Patient reports that he is starting to feel better. He  was given Zofran ODT here in the ER and is now tolerating oral intake. He had abdominal cramping earlier, however, abdominal exam is entirely unremarkable currently. He has no tenderness to palpation in all 4 quadrants. He has normal bowel sounds present. Lab Work is unremarkable. As examination is normal, workup is normal and he is feeling better, will discharge with symptomatically treatment.  Final Clinical Impressions(s) / ED Diagnoses   Final diagnoses:  Nausea vomiting and diarrhea    New Prescriptions New Prescriptions   DIPHENOXYLATE-ATROPINE (LOMOTIL) 2.5-0.025 MG TABLET    Take 1 tablet by mouth 4 (four) times daily as needed for diarrhea or loose stools.   ONDANSETRON (ZOFRAN) 4 MG TABLET    Take 1 tablet (4 mg total) by mouth every 6 (six) hours.  I personally performed the services described in this documentation, which was scribed in my presence. The recorded information has been reviewed and is accurate.     Gilda Crease,  MD 11/24/16 [id number]

## 2018-10-31 ENCOUNTER — Ambulatory Visit: Payer: BC Managed Care – PPO | Admitting: Family Medicine

## 2018-10-31 ENCOUNTER — Encounter: Payer: Self-pay | Admitting: Family Medicine

## 2018-10-31 DIAGNOSIS — F439 Reaction to severe stress, unspecified: Secondary | ICD-10-CM | POA: Diagnosis not present

## 2018-10-31 MED ORDER — DOXEPIN HCL 6 MG PO TABS: ORAL_TABLET | ORAL | 2 refills | Status: DC

## 2018-10-31 NOTE — Assessment & Plan Note (Signed)
Secondary to adjustment reaction due to recent passing of his longtime pet.  He is seeing psychiatry and is on Tranxene which seems to be helping.  He has had some difficulty sleeping in addition to his anxiety symptoms.  Will start low-dose doxepin 6 mg at night per his request.  Discussed potential side effects of this medication.  He will follow-up with me in 4 to 6 weeks.

## 2018-10-31 NOTE — Patient Instructions (Addendum)
It was very nice to see you today!  We will start doxepin 6mg  nightly. Please let me know if you have any side effects.  Come back to see me in 4-6 weeks for your physical with blood work, or sooner as needed.   Take care, Dr Jimmey RalphParker

## 2018-10-31 NOTE — Progress Notes (Signed)
Subjective:  Glenn Kirby is a 57 y.o. male who presents today with a chief complaint of stress and to establish care.  HPI:  Stress/anxiety Patient has had intermittent episodes of depression and anxiety in the past several years.  Significantly worsened a week ago when his cat of 10 years passed away.  He has had some difficulty sleeping.  Has occasionally been tearful.  He saw psychiatrist couple days ago and was started on Tranxene.  This is worked well however still has significant symptoms that are bothersome to him.  He has never been on any other medications in the past.  He has difficulty sleeping.  No other obvious alleviating or aggravating factors.  Depression screen PHQ 2/9 10/31/2018  Decreased Interest 1  Down, Depressed, Hopeless 1  PHQ - 2 Score 2  Altered sleeping 1  Tired, decreased energy 1  Change in appetite 1  Feeling bad or failure about yourself  0  Trouble concentrating 1  Moving slowly or fidgety/restless 0  Suicidal thoughts 0  PHQ-9 Score 6  Difficult doing work/chores Somewhat difficult    GAD 7 : Generalized Anxiety Score 10/31/2018  Nervous, Anxious, on Edge 1  Control/stop worrying 1  Worry too much - different things 1  Trouble relaxing 0  Restless 1  Easily annoyed or irritable 1  Afraid - awful might happen 1  Total GAD 7 Score 6  Anxiety Difficulty Somewhat difficult   ROS: Per HPI, otherwise a complete review of systems was negative.   PMH:  The following were reviewed and entered/updated in epic: History reviewed. No pertinent past medical history. Patient Active Problem List   Diagnosis Date Noted  . Stress 10/31/2018   Past Surgical History:  Procedure Laterality Date  . LEFT HEART CATHETERIZATION WITH CORONARY ANGIOGRAM N/A 05/12/2013   Procedure: LEFT HEART CATHETERIZATION WITH CORONARY ANGIOGRAM;  Surgeon: Robynn PaneMohan N Harwani, MD;  [facility];  Service: Cardiovascular;  Laterality: N/A;    Family History    Problem Relation Age of Onset  . Cancer Neg Hx     Medications- reviewed and updated Current Outpatient Medications  Medication Sig Dispense Refill  . clorazepate (TRANXENE) 3.75 MG tablet Take 3.75 mg by mouth 2 (two) times daily as needed for anxiety.    . Doxepin HCl 6 MG TABS Take 1 tab 30 minutes before bed. 30 tablet 2   No current facility-administered medications for this visit.     Allergies-reviewed and updated No Known Allergies  Social History   Socioeconomic History  . Marital status: Married    Spouse name: Not on file  . Number of children: Not on file  . Years of education: Not on file  . Highest education level: Not on file  Occupational History  . Not on file  Social Needs  . Financial resource strain: Not on file  . Food insecurity:    Worry: Not on file    Inability: Not on file  . Transportation needs:    Medical: Not on file    Non-medical: Not on file  Tobacco Use  . Smoking status: Never Smoker  . Smokeless tobacco: Never Used  Substance and Sexual Activity  . Alcohol use: No    Frequency: Never  . Drug use: Never  . Sexual activity: Not on file  Lifestyle  . Physical activity:    Days per week: Not on file    Minutes per session: Not on file  . Stress: Not on file  Relationships  . Social connections:    Talks on phone: Not on file    Gets together: Not on file    Attends religious service: Not on file    Active member of club or organization: Not on file    Attends meetings of clubs or organizations: Not on file    Relationship status: Not on file  Other Topics Concern  . Not on file  Social History Narrative  . Not on file     Objective:  Physical Exam: BP 120/76 (BP Location: Left Arm, Patient Position: Sitting, Cuff Size: Normal)   Pulse 76   Temp 98.5 F (36.9 C) (Oral)   Ht 5\' 8"  (1.727 m)   Wt 189 lb 12 oz (86.1 kg)   SpO2 96%   BMI 28.85 kg/m   Gen: NAD, resting comfortably CV: RRR with no murmurs  appreciated Pulm: NWOB, CTAB with no crackles, wheezes, or rhonchi GI: Normal bowel sounds present. Soft, Nontender, Nondistended. MSK: No edema, cyanosis, or clubbing noted Skin: Warm, dry Neuro: Grossly normal, moves all extremities Psych: Normal affect and thought content  Assessment/Plan:  Stress Secondary to adjustment reaction due to recent passing of his longtime pet.  He is seeing psychiatry and is on Tranxene which seems to be helping.  He has had some difficulty sleeping in addition to his anxiety symptoms.  Will start low-dose doxepin 6 mg at night per his request.  Discussed potential side effects of this medication.  He will follow-up with me in 4 to 6 weeks.  Preventative Healthcare Patient was instructed to return soon for CPE.  Obtain records from previous PCP. Health Maintenance Due  Topic Date Due  . Hepatitis C Screening  1961/04/21  . HIV Screening  09/07/1975  . TETANUS/TDAP  09/07/1979  . COLONOSCOPY  09/06/2010   Glenn Degree. [name], MD 10/31/2018 3:42 PM

## 2018-11-18 ENCOUNTER — Ambulatory Visit: Payer: BC Managed Care – PPO | Admitting: Sports Medicine

## 2018-11-18 ENCOUNTER — Encounter: Payer: Self-pay | Admitting: Sports Medicine

## 2018-11-18 VITALS — BP 126/72 | HR 93 | Ht 68.0 in | Wt 193.2 lb

## 2018-11-18 DIAGNOSIS — M25532 Pain in left wrist: Secondary | ICD-10-CM

## 2018-11-18 MED ORDER — DICLOFENAC SODIUM 2 % TD SOLN: 1.0000 "application " | Freq: Two times a day (BID) | TRANSDERMAL | 0 refills | Status: AC

## 2018-11-18 MED ORDER — DICLOFENAC SODIUM 2 % TD SOLN: 1.0000 "application " | Freq: Two times a day (BID) | TRANSDERMAL | 2 refills | Status: DC

## 2018-11-18 NOTE — Patient Instructions (Signed)
Pennsaid instructions: You have been given a sample/prescription for Pennsaid, a topical medication.     You are to apply this gel to your injured body part twice daily (morning and evening).   A little goes a long way so you can use about a pea-sized amount for each area.   Spread this small amount over the area into a thin film and let it dry.   Be sure that you do not rub the gel into your skin for more than 10 or 15 seconds otherwise it can irritate you skin.    Once you apply the gel, please do not put any other lotion or clothing in contact with that area for 30 minutes to allow the gel to absorb into your skin.   Some people are sensitive to the medication and can develop a sunburn-like rash.  If you have only mild symptoms it is okay to continue to use the medication but if you have any breakdown of your skin you should discontinue its use and please let us know.   If you have been written a prescription for Pennsaid, you will receive a pump bottle of this topical gel through a mail order pharmacy.  The instructions on the bottle will say to apply two pumps twice a day which may be too much gel for your particular area so use the pea-sized amount as your guide.  

## 2018-11-18 NOTE — Progress Notes (Signed)
Glenn FellsMichael D. Delorise Shinerigby, Glenn Kirby  [facility] [phone]  Glenn Kirby - 57 y.o. male MRN [id number]  Date of birth: 1961/06/23  Visit Date:   PCP: Ardith DarkParker, Caleb M, MD   Referred by: Ardith DarkParker, Caleb M, MD   SUBJECTIVE:  Chief Complaint  Patient presents with  . New Patient (Initial Visit)    L wrist pain    HPI: Patient presents with 2 days of worsening left dorsal wrist pain that is described as moderate and sharp.  He has a small amount of intermittent mild swelling.  It is helped with ice as well as slightly with Aspercreme and Aleve.  He denies any known trauma.  He denies any known exacerbating activities.  REVIEW OF SYSTEMS: Denies night time disturbances. Denies fevers, chills, or night sweats. Denies unexplained weight loss. Denies personal history of cancer. Denies changes in bowel or bladder habits. Denies recent unreported falls. Denies new or worsening dyspnea or wheezing. Denies headaches or dizziness.  Denies numbness, tingling or weakness  In the extremities.  Denies dizziness or presyncopal episodes Denies lower extremity edema   HISTORY:  Prior history reviewed and updated per electronic medical record.  Social History   Occupational History  . Not on file  Tobacco Use  . Smoking status: Never Smoker  . Smokeless tobacco: Never Used  Substance and Sexual Activity  . Alcohol use: No    Frequency: Never  . Drug use: Never  . Sexual activity: Not on file   Social History   Social History Narrative  . Not on file    No past medical history on file.  Past Surgical History:  Procedure Laterality Date  . LEFT HEART CATHETERIZATION WITH CORONARY ANGIOGRAM N/A 05/12/2013   Procedure: LEFT HEART CATHETERIZATION WITH CORONARY ANGIOGRAM;  Surgeon: Robynn PaneMohan N Harwani, MD;  [facility];  Service: Cardiovascular;  Laterality: N/A;    family history is negative for Cancer.  DATA OBTAINED & REVIEWED:  No  results for input(s): HGBA1C, LABURIC, CREATINE, CALCIUM, AST, ALT, TSH in the last 8760 hours.  Invalid input(s): MAGNESIUM, CK No problems updated. No specialty comments available.  OBJECTIVE:  VS:  HT:5\' 8"  (172.7 cm)   WT:193 lb 3.2 oz (87.6 kg)  BMI:29.38    BP:126/72  HR:93bpm  TEMP: ( )  RESP:95 %   PHYSICAL EXAM: CONSTITUTIONAL: Well-developed, Well-nourished and In no acute distress PSYCHIATRIC : Alert & appropriately interactive. and Not depressed or anxious appearing. RESPIRATORY : No increased work of breathing and Trachea Midline EYES : Pupils are equal., EOM intact without nystagmus. and No scleral icterus.  VASCULAR EXAM : Warm and well perfused NEURO: unremarkable  MSK Exam:  Left wrist  Well aligned No significant deformity. No overlying skin changes TTP over: Scapholunate ligament with a small amount of laxity with scapholunate testing.  This is mild and improves with a cavitation produced with testing. Mild swelling No effusion No synovitis He has good grip strength.  Slightly limited wrist extension with approximately 5 degrees flexion contracture of the elbow as well it does improve with passive range of motion.  He is guarding the wrist slightly.  He has normal ulnar deviation and radial deviation without pain.    ASSESSMENT  1. Arthralgia of left wrist     PLAN:  Pertinent additional documentation may be included in corresponding procedure notes, imaging studies, problem based documentation and patient instructions.  Procedures:  None  Medications:  Meds ordered this encounter  Medications  . Diclofenac Sodium (PENNSAID) 2 % SOLN    Sig: Place 1 application onto the skin 2 (two) times daily.    Dispense:  112 g    Refill:  2    Home Phone      [phone] Mobile          [phone]   . Diclofenac Sodium (PENNSAID) 2 % SOLN    Sig: Place 1 application onto the skin 2 (two) times daily for 1 day.    Dispense:  8 g    Refill:  0     Discussion/Instructions: No problem-specific Assessment & Plan notes found for this encounter. RICE (Rest, ICE, Compression, Elevation) principles reviewed with the patient. Discussed red flag symptoms that warrant earlier emergent evaluation and patient voices understanding. Activity modifications and the importance of avoiding exacerbating activities (limiting pain to no more than a 4 / 10 during or following activity) recommended and discussed.   Ultimately this should Glenn Kirby well with conservative measures including prescription and samples for Pennsaid.  We did discuss a small amount of synovitis can cause some persistent symptoms and if any lack of improvement further diagnostic evaluation with clenched fist wrist x-rays will be recommended.    Return if symptoms worsen or fail to improve.          Andrena MewsMichael D Rigby, Glenn Kirby    [facility] Physician

## 2018-11-25 ENCOUNTER — Ambulatory Visit: Payer: BC Managed Care – PPO | Admitting: Family Medicine

## 2018-11-25 DIAGNOSIS — Z0289 Encounter for other administrative examinations: Secondary | ICD-10-CM

## 2018-11-26 ENCOUNTER — Encounter: Payer: Self-pay | Admitting: Family Medicine

## 2018-12-16 ENCOUNTER — Encounter: Payer: Self-pay | Admitting: Physician Assistant

## 2018-12-16 ENCOUNTER — Ambulatory Visit: Payer: BC Managed Care – PPO | Admitting: Physician Assistant

## 2018-12-16 VITALS — BP 110/70 | HR 98 | Temp 98.4°F | Ht 68.0 in | Wt 191.4 lb

## 2018-12-16 DIAGNOSIS — J069 Acute upper respiratory infection, unspecified: Secondary | ICD-10-CM | POA: Diagnosis not present

## 2018-12-16 MED ORDER — BENZONATATE 100 MG PO CAPS: 100.0000 mg | ORAL_CAPSULE | Freq: Three times a day (TID) | ORAL | 1 refills | Status: DC | PRN

## 2018-12-16 NOTE — Progress Notes (Signed)
Glenn Kirby is a 58 y.o. male here for a new problem.  I acted as a Neurosurgeonscribe for Energy East CorporationSamantha Cameshia Cressman, PA-C Glenn Mullonna Orphanos, LPN  History of Present Illness:   Chief Complaint  Patient presents with  . Cough    Cough  This is a new problem. Episode onset: Started on Saturday. The problem has been gradually worsening. The problem occurs constantly. The cough is productive of sputum (yellow sputum). Associated symptoms include headaches, nasal congestion and postnasal drip. Pertinent negatives include no chills, ear pain, fever, sore throat, shortness of breath or wheezing. Associated symptoms comments: Chest congestion. The symptoms are aggravated by lying down. Treatments tried: Aleve. There is no history of asthma, bronchitis or pneumonia.    History reviewed. No pertinent past medical history.   Social History   Socioeconomic History  . Marital status: Married    Spouse name: Not on file  . Number of children: Not on file  . Years of education: Not on file  . Highest education level: Not on file  Occupational History  . Not on file  Social Needs  . Financial resource strain: Not on file  . Food insecurity:    Worry: Not on file    Inability: Not on file  . Transportation needs:    Medical: Not on file    Non-medical: Not on file  Tobacco Use  . Smoking status: Never Smoker  . Smokeless tobacco: Never Used  Substance and Sexual Activity  . Alcohol use: No    Frequency: Never  . Drug use: Never  . Sexual activity: Not on file  Lifestyle  . Physical activity:    Days per week: Not on file    Minutes per session: Not on file  . Stress: Not on file  Relationships  . Social connections:    Talks on phone: Not on file    Gets together: Not on file    Attends religious service: Not on file    Active member of club or organization: Not on file    Attends meetings of clubs or organizations: Not on file    Relationship status: Not on file  . Intimate partner violence:   Fear of current or ex partner: Not on file    Emotionally abused: Not on file    Physically abused: Not on file    Forced sexual activity: Not on file  Other Topics Concern  . Not on file  Social History Narrative  . Not on file    Past Surgical History:  Procedure Laterality Date  . LEFT HEART CATHETERIZATION WITH CORONARY ANGIOGRAM N/A 05/12/2013   Procedure: LEFT HEART CATHETERIZATION WITH CORONARY ANGIOGRAM;  Surgeon: Robynn PaneMohan N Harwani, MD;  [facility];  Service: Cardiovascular;  Laterality: N/A;    Family History  Problem Relation Age of Onset  . Cancer Neg Hx     No Known Allergies  Current Medications:   Current Outpatient Medications:  .  aspirin EC 81 MG tablet, Take by mouth., Disp: , Rfl:  .  clorazepate (TRANXENE) 3.75 MG tablet, Take 3.75 mg by mouth 2 (two) times daily as needed for anxiety., Disp: , Rfl:  .  Diclofenac Sodium (PENNSAID) 2 % SOLN, Place 1 application onto the skin 2 (two) times daily., Disp: 112 g, Rfl: 2 .  Doxepin HCl 6 MG TABS, Take 1 tab 30 minutes before bed., Disp: 30 tablet, Rfl: 2 .  benzonatate (TESSALON) 100 MG capsule, Take 1 capsule (100 mg total) by mouth  3 (three) times daily as needed for cough., Disp: 20 capsule, Rfl: 1   Review of Systems:   Review of Systems  Constitutional: Negative for chills and fever.  HENT: Positive for postnasal drip. Negative for ear pain and sore throat.   Respiratory: Positive for cough. Negative for shortness of breath and wheezing.   Neurological: Positive for headaches.    Vitals:   Vitals:   12/16/18 0919  BP: 110/70  Pulse: 98  Temp: 98.4 F (36.9 C)  TempSrc: Oral  SpO2: 95%  Weight: 191 lb 6.1 oz (86.8 kg)  Height: 5\' 8"  (1.727 m)     Body mass index is 29.1 kg/m.  Physical Exam:   Physical Exam Vitals signs and nursing note reviewed.  Constitutional:      General: He is not in acute distress.    Appearance: He is well-developed. He is not ill-appearing or  toxic-appearing.  HENT:     Head: Normocephalic and atraumatic.     Right Ear: Tympanic membrane, ear canal and external ear normal. Tympanic membrane is not erythematous, retracted or bulging.     Left Ear: Tympanic membrane, ear canal and external ear normal. Tympanic membrane is not erythematous, retracted or bulging.     Nose: Mucosal edema, congestion and rhinorrhea present.     Right Sinus: No maxillary sinus tenderness or frontal sinus tenderness.     Left Sinus: No maxillary sinus tenderness or frontal sinus tenderness.     Mouth/Throat:     Lips: Pink.     Mouth: Mucous membranes are moist.     Pharynx: Uvula midline. Posterior oropharyngeal erythema present.     Tonsils: Swelling: 0 on the right. 0 on the left.  Eyes:     General: Lids are normal.     Conjunctiva/sclera: Conjunctivae normal.  Neck:     Trachea: Trachea normal.  Cardiovascular:     Rate and Rhythm: Normal rate and regular rhythm.     Pulses: Normal pulses.     Heart sounds: Normal heart sounds, S1 normal and S2 normal.     Comments: No LE edema Pulmonary:     Effort: Pulmonary effort is normal.     Breath sounds: Normal breath sounds. No decreased breath sounds, wheezing, rhonchi or rales.  Lymphadenopathy:     Cervical: No cervical adenopathy.  Skin:    General: Skin is warm and dry.  Neurological:     Mental Status: He is alert.     GCS: GCS eye subscore is 4. GCS verbal subscore is 5. GCS motor subscore is 6.  Psychiatric:        Speech: Speech normal.        Behavior: Behavior normal. Behavior is cooperative.     Assessment and Plan:   Glenn Kirby was seen today for cough.  Diagnoses and all orders for this visit:  Upper respiratory tract infection, unspecified type  Other orders -     benzonatate (TESSALON) 100 MG capsule; Take 1 capsule (100 mg total) by mouth 3 (three) times daily as needed for cough.   No red flags on exam.  Suspect viral etiology -- will not prescribed abx at this time.  Will initiate supportive care -- tessalon perles, mucinex DM. Discussed taking medications as prescribed. Reviewed return precautions including worsening fever, SOB, worsening cough or other concerns. Push fluids and rest. I recommend that patient follow-up if symptoms worsen or persist despite treatment x 7-10 days, sooner if needed.  . Reviewed expectations re: course  of current medical issues. . Discussed self-management of symptoms. . Outlined signs and symptoms indicating need for more acute intervention. . Patient verbalized understanding and all questions were answered. . See orders for this visit as documented in the electronic medical record. . Patient received an After-Visit Summary.  CMA or LPN served as scribe during this visit. History, Physical, and Plan performed by medical provider. The above documentation has been reviewed and is accurate and complete.  Jarold Motto, PA-C

## 2018-12-16 NOTE — Patient Instructions (Signed)
It was great to see you!  You have a viral upper respiratory infection. Antibiotics are not needed for this.  Viral infections usually take 7-10 days to resolve.  The cough can last a few weeks to go away.  I have sent in tesslon perles for your cough.  You may also try mucinex DM     Push fluids and get plenty of rest. Please return if you are not improving as expected, or if you have high fevers (>101.5) or difficulty swallowing or worsening productive cough.  Call clinic with questions.  I hope you start feeling better soon!

## 2019-02-16 ENCOUNTER — Other Ambulatory Visit: Payer: Self-pay | Admitting: Family Medicine

## 2019-02-16 ENCOUNTER — Encounter: Payer: Self-pay | Admitting: Family Medicine

## 2019-02-17 ENCOUNTER — Ambulatory Visit (INDEPENDENT_AMBULATORY_CARE_PROVIDER_SITE_OTHER): Payer: BC Managed Care – PPO | Admitting: Family Medicine

## 2019-02-17 ENCOUNTER — Other Ambulatory Visit: Payer: Self-pay

## 2019-02-17 DIAGNOSIS — G4700 Insomnia, unspecified: Secondary | ICD-10-CM | POA: Diagnosis not present

## 2019-02-17 DIAGNOSIS — F439 Reaction to severe stress, unspecified: Secondary | ICD-10-CM

## 2019-02-17 MED ORDER — DOXEPIN HCL 6 MG PO TABS: ORAL_TABLET | ORAL | 2 refills | Status: DC

## 2019-02-17 MED ORDER — CLORAZEPATE DIPOTASSIUM 3.75 MG PO TABS: 3.7500 mg | ORAL_TABLET | Freq: Two times a day (BID) | ORAL | 3 refills | Status: DC | PRN

## 2019-02-17 NOTE — Assessment & Plan Note (Signed)
Database reviewed with no red flags.  Discussed treatment options.  We will refill Tranxene 3.75 mg twice daily as needed.  Offered referral for psychotherapy however patient declined.  Hopefully as COVID-19 pandemic resolves, symptoms will gradually improve.  Discussed reasons to return to care.  Follow-up as needed.

## 2019-02-17 NOTE — Assessment & Plan Note (Signed)
Doxepin refilled.

## 2019-02-17 NOTE — Progress Notes (Signed)
    Chief Complaint:  Glenn Kirby is a 58 y.o. male who presents today for a virtual office visit with a chief complaint of stress.   Assessment/Plan:  Insomnia Doxepin refilled.  Stress Database reviewed with no red flags.  Discussed treatment options.  We will refill Tranxene 3.75 mg twice daily as needed.  Offered referral for psychotherapy however patient declined.  Hopefully as COVID-19 pandemic resolves, symptoms will gradually improve.  Discussed reasons to return to care.  Follow-up as needed.    Subjective:  HPI:  Stress  Chronic problem. Worsening.  He thinks that symptoms have recently worsened due to COVID-19 pandemic.  He has had severe anxiety regarding health care and contracting COVID-19.  States that he has not left the house for the past several days and is only left the house 1 time in the past 30 days.  He also frequently washes his hands.  He is very concerned about contracting the illness.  He has previously been on Tranxene which helps with his symptoms.  He would like a refill on that today.  He is not interested in psychotherapy at this time.  No other obvious alleviating or aggravating factors.  Insomnia Chronic problem.  Worsening.  Takes doxepin 6 mg nightly which seems to help.  Would like refill today.  No noted side effects.  No next-day somnolence.  ROS: Per HPI  PMH: He reports that he has never smoked. He has never used smokeless tobacco. He reports that he does not drink alcohol or use drugs.      Objective/Observations  Physical Exam: Gen: NAD, resting comfortably Pulm: Normal work of breathing Neuro: Grossly normal, moves all extremities Psych: Normal affect and thought content  Virtual Visit via Video   I connected with Glenn Kirby on 02/17/19 at  1:00 PM EDT by a video enabled telemedicine application and verified that I am speaking with the correct person using two identifiers. I discussed the limitations of evaluation and management by  telemedicine and the availability of in person appointments. The patient expressed understanding and agreed to proceed.  Partway through the video application, the video cut out.  The rest of the encounter was then completed via voice.  Patient location: Home Provider location: [place] [employer] Persons participating in the virtual visit: Myself and patient     Katina Degree. [name], MD 02/17/2019 1:20 PM

## 2019-05-21 ENCOUNTER — Ambulatory Visit (INDEPENDENT_AMBULATORY_CARE_PROVIDER_SITE_OTHER): Payer: BC Managed Care – PPO | Admitting: Family Medicine

## 2019-05-21 ENCOUNTER — Encounter: Payer: Self-pay | Admitting: Family Medicine

## 2019-05-21 ENCOUNTER — Other Ambulatory Visit: Payer: Self-pay

## 2019-05-21 VITALS — BP 116/72 | HR 83 | Temp 98.3°F | Ht 68.0 in | Wt 201.0 lb

## 2019-05-21 DIAGNOSIS — G4700 Insomnia, unspecified: Secondary | ICD-10-CM | POA: Diagnosis not present

## 2019-05-21 DIAGNOSIS — Z6830 Body mass index (BMI) 30.0-30.9, adult: Secondary | ICD-10-CM

## 2019-05-21 DIAGNOSIS — Z13220 Encounter for screening for lipoid disorders: Secondary | ICD-10-CM

## 2019-05-21 DIAGNOSIS — Z0001 Encounter for general adult medical examination with abnormal findings: Secondary | ICD-10-CM | POA: Diagnosis not present

## 2019-05-21 DIAGNOSIS — Z131 Encounter for screening for diabetes mellitus: Secondary | ICD-10-CM

## 2019-05-21 DIAGNOSIS — Z125 Encounter for screening for malignant neoplasm of prostate: Secondary | ICD-10-CM

## 2019-05-21 DIAGNOSIS — Z1159 Encounter for screening for other viral diseases: Secondary | ICD-10-CM

## 2019-05-21 DIAGNOSIS — E669 Obesity, unspecified: Secondary | ICD-10-CM

## 2019-05-21 DIAGNOSIS — F439 Reaction to severe stress, unspecified: Secondary | ICD-10-CM | POA: Diagnosis not present

## 2019-05-21 DIAGNOSIS — R1011 Right upper quadrant pain: Secondary | ICD-10-CM

## 2019-05-21 LAB — CBC
HCT: 46.5 % (ref 39.0–52.0)
Hemoglobin: 15.8 g/dL (ref 13.0–17.0)
MCHC: 34.1 g/dL (ref 30.0–36.0)
MCV: 89 fl (ref 78.0–100.0)
Platelets: 247 10*3/uL (ref 150.0–400.0)
RBC: 5.23 Mil/uL (ref 4.22–5.81)
RDW: 12.9 % (ref 11.5–15.5)
WBC: 2.9 10*3/uL — ABNORMAL LOW (ref 4.0–10.5)

## 2019-05-21 LAB — TSH: TSH: 9.7 u[IU]/mL — ABNORMAL HIGH (ref 0.35–4.50)

## 2019-05-21 LAB — COMPREHENSIVE METABOLIC PANEL
ALT: 18 U/L (ref 0–53)
AST: 19 U/L (ref 0–37)
Albumin: 4.3 g/dL (ref 3.5–5.2)
Alkaline Phosphatase: 64 U/L (ref 39–117)
BUN: 9 mg/dL (ref 6–23)
CO2: 28 mEq/L (ref 19–32)
Calcium: 9.2 mg/dL (ref 8.4–10.5)
Chloride: 102 mEq/L (ref 96–112)
Creatinine, Ser: 0.95 mg/dL (ref 0.40–1.50)
GFR: 81.23 mL/min (ref >60.00)
Glucose, Bld: 104 mg/dL — ABNORMAL HIGH (ref 70–99)
Potassium: 4 mEq/L (ref 3.5–5.1)
Sodium: 137 mEq/L (ref 135–145)
Total Bilirubin: 0.9 mg/dL (ref 0.2–1.2)
Total Protein: 6.9 g/dL (ref 6.0–8.3)

## 2019-05-21 LAB — LIPID PANEL
Cholesterol: 172 mg/dL (ref 0–200)
HDL: 52.5 mg/dL (ref >39.00)
LDL Cholesterol: 104 mg/dL — ABNORMAL HIGH (ref 0–99)
NonHDL: 119.43
Total CHOL/HDL Ratio: 3
Triglycerides: 76 mg/dL (ref 0.0–149.0)
VLDL: 15.2 mg/dL (ref 0.0–40.0)

## 2019-05-21 LAB — PSA: PSA: 0.27 ng/mL (ref 0.10–4.00)

## 2019-05-21 NOTE — Patient Instructions (Signed)
It was very nice to see you today!  We will check blood work today.  We may need to get an ultrasound if you continue to have abdominal pain and your labs were normal.   Eat at least 3 REAL meals and 1-2 snacks per day.  Aim for no more than 5 hours between eating.  Eat breakfast within one hour of getting up.    Obtain twice as many fruits/vegetables as protein or carbohydrate foods for both lunch and dinner.   Cut down on sweet beverages. This includes juice, soda, and sweet tea.    Exercise at least 150 minutes every week.   Come back in 1 year for your next physical or sooner as needed.   Take care, Dr Jerline Pain   Preventive Care 12-31 Years Old, Male Preventive care refers to lifestyle choices and visits with your health care provider that can promote health and wellness. This includes:  A yearly physical exam. This is also called an annual well check.  Regular dental and eye exams.  Immunizations.  Screening for certain conditions.  Healthy lifestyle choices, such as eating a healthy diet, getting regular exercise, not using drugs or products that contain nicotine and tobacco, and limiting alcohol use. What can I expect for my preventive care visit? Physical exam Your health care provider will check:  Height and weight. These may be used to calculate body mass index (BMI), which is a measurement that tells if you are at a healthy weight.  Heart rate and blood pressure.  Your skin for abnormal spots. Counseling Your health care provider may ask you questions about:  Alcohol, tobacco, and drug use.  Emotional well-being.  Home and relationship well-being.  Sexual activity.  Eating habits.  Work and work Statistician. What immunizations do I need?  Influenza (flu) vaccine  This is recommended every year. Tetanus, diphtheria, and pertussis (Tdap) vaccine  You may need a Td booster every 10 years. Varicella (chickenpox) vaccine  You may need this  vaccine if you have not already been vaccinated. Zoster (shingles) vaccine  You may need this after age 26. Measles, mumps, and rubella (MMR) vaccine  You may need at least one dose of MMR if you were born in 1957 or later. You may also need a second dose. Pneumococcal conjugate (PCV13) vaccine  You may need this if you have certain conditions and were not previously vaccinated. Pneumococcal polysaccharide (PPSV23) vaccine  You may need one or two doses if you smoke cigarettes or if you have certain conditions. Meningococcal conjugate (MenACWY) vaccine  You may need this if you have certain conditions. Hepatitis A vaccine  You may need this if you have certain conditions or if you travel or work in places where you may be exposed to hepatitis A. Hepatitis B vaccine  You may need this if you have certain conditions or if you travel or work in places where you may be exposed to hepatitis B. Haemophilus influenzae type b (Hib) vaccine  You may need this if you have certain risk factors. Human papillomavirus (HPV) vaccine  If recommended by your health care provider, you may need three doses over 6 months. You may receive vaccines as individual doses or as more than one vaccine together in one shot (combination vaccines). Talk with your health care provider about the risks and benefits of combination vaccines. What tests do I need? Blood tests  Lipid and cholesterol levels. These may be checked every 5 years, or more frequently if you  are over 81 years old.  Hepatitis C test.  Hepatitis B test. Screening  Lung cancer screening. You may have this screening every year starting at age 5 if you have a 30-pack-year history of smoking and currently smoke or have quit within the past 15 years.  Prostate cancer screening. Recommendations will vary depending on your family history and other risks.  Colorectal cancer screening. All adults should have this screening starting at age 6  and continuing until age 10. Your health care provider may recommend screening at age 76 if you are at increased risk. You will have tests every 1-10 years, depending on your results and the type of screening test.  Diabetes screening. This is done by checking your blood sugar (glucose) after you have not eaten for a while (fasting). You may have this done every 1-3 years.  Sexually transmitted disease (STD) testing. Follow these instructions at home: Eating and drinking  Eat a diet that includes fresh fruits and vegetables, whole grains, lean protein, and low-fat dairy products.  Take vitamin and mineral supplements as recommended by your health care provider.  Do not drink alcohol if your health care provider tells you not to drink.  If you drink alcohol: ? Limit how much you have to 0-2 drinks a day. ? Be aware of how much alcohol is in your drink. In the [place] one drink equals one 12 oz bottle of beer (355 mL), one 5 oz glass of wine (148 mL), or one 1 oz glass of hard liquor (44 mL). Lifestyle  Take daily care of your teeth and gums.  Stay active. Exercise for at least 30 minutes on 5 or more days each week.  Do not use any products that contain nicotine or tobacco, such as cigarettes, e-cigarettes, and chewing tobacco. If you need help quitting, ask your health care provider.  If you are sexually active, practice safe sex. Use a condom or other form of protection to prevent STIs (sexually transmitted infections).  Talk with your health care provider about taking a low-dose aspirin every day starting at age 74. What's next?  Go to your health care provider once a year for a well check visit.  Ask your health care provider how often you should have your eyes and teeth checked.  Stay up to date on all vaccines. This information is not intended to replace advice given to you by your health care provider. Make sure you discuss any questions you have with your health care  provider. Document Released: 12/02/2015 Document Revised: 10/30/2018 Document Reviewed: 10/30/2018 Elsevier Patient Education  2020 [employer].

## 2019-05-21 NOTE — Assessment & Plan Note (Signed)
Stable.  Continue Tranxene 3.75 mg twice daily as needed.

## 2019-05-21 NOTE — Progress Notes (Addendum)
Chief Complaint:  Glenn Kirby is a 58 y.o. male who presents today for his annual comprehensive physical exam.    Assessment/Plan:  Insomnia Stable.  Continue doxepin 6 mg nightly.  Stress Stable.  Continue Tranxene 3.75 mg twice daily as needed.  RUQ Abdominal Pain No red flags.  Benign abdominal exam.  Will check CBC and C met.  If symptoms persist will likely need right upper quadrant ultrasound.  BMI 30 / Obesity Discussed lifestyle modifications.   Preventative Healthcare: Check hep C antibody, PSA, CBC, C met, and TSH.  Will be going to get a colonoscopy later this year.  Patient Counseling(The following topics were reviewed and/or handout was given):  -Nutrition: Stressed importance of moderation in sodium/caffeine intake, saturated fat and cholesterol, caloric balance, sufficient intake of fresh fruits, vegetables, and fiber.  -Stressed the importance of regular exercise.   -Substance Abuse: Discussed cessation/primary prevention of tobacco, alcohol, or other drug use; driving or other dangerous activities under the influence; availability of treatment for abuse.   -Injury prevention: Discussed safety belts, safety helmets, smoke detector, smoking near bedding or upholstery.   -Sexuality: Discussed sexually transmitted diseases, partner selection, use of condoms, avoidance of unintended pregnancy and contraceptive alternatives.   -Dental health: Discussed importance of regular tooth brushing, flossing, and dental visits.  -Health maintenance and immunizations reviewed. Please refer to Health maintenance section.  Return to care in 1 year for next preventative visit.     Subjective:  HPI:  He has no acute complaints today.   He has had some issues with right upper quadrant abdominal pain for the past several months.  Cares about once per week.  No associated nausea or vomiting.  No obvious aggravating or alleviating factors.  Not associated with food.  No fevers or  chills.  His stable, chronic medical conditions are outlined below:  # Insomnia / Stress - On doxepin 28m nightly - On tranzene 3.74mtwice daily as needed - Tolerating all well without side effects - ROS: No reported SI or HI  Lifestyle Diet: No specific diets.  Exercise: No specific exercises.   Depression screen PHQ 2/9 10/31/2018  Decreased Interest 1  Down, Depressed, Hopeless 1  PHQ - 2 Score 2  Altered sleeping 1  Tired, decreased energy 1  Change in appetite 1  Feeling bad or failure about yourself  0  Trouble concentrating 1  Moving slowly or fidgety/restless 0  Suicidal thoughts 0  PHQ-9 Score 6  Difficult doing work/chores Somewhat difficult   Health Maintenance Due  Topic Date Due  . Hepatitis C Screening  1002-22-1962. HIV Screening  09/07/1975  . COLONOSCOPY  09/06/2010     ROS: Per HPI, otherwise a complete review of systems was negative.   PMH:  The following were reviewed and entered/updated in epic: History reviewed. No pertinent past medical history. Patient Active Problem List   Diagnosis Date Noted  . Insomnia 02/17/2019  . Stress 10/31/2018   Past Surgical History:  Procedure Laterality Date  . LEFT HEART CATHETERIZATION WITH CORONARY ANGIOGRAM N/A 05/12/2013   Procedure: LEFT HEART CATHETERIZATION WITH CORONARY ANGIOGRAM;  Surgeon: MoClent DemarkMD;  [facility];  Service: Cardiovascular;  Laterality: N/A;    Family History  Problem Relation Age of Onset  . Cancer Neg Hx     Medications- reviewed and updated Current Outpatient Medications  Medication Sig Dispense Refill  . clorazepate (TRANXENE) 3.75 MG tablet Take 1 tablet (3.75 mg total) by mouth  2 (two) times daily as needed for anxiety. 60 tablet 3  . Diclofenac Sodium (PENNSAID) 2 % SOLN Place 1 application onto the skin 2 (two) times daily. 112 g 2  . Doxepin HCl 6 MG TABS Take 1 tab 30 minutes before bed. 30 tablet 2   No current facility-administered  medications for this visit.     Allergies-reviewed and updated No Known Allergies  Social History   Socioeconomic History  . Marital status: Married    Spouse name: Not on file  . Number of children: Not on file  . Years of education: Not on file  . Highest education level: Not on file  Occupational History  . Not on file  Social Needs  . Financial resource strain: Not on file  . Food insecurity    Worry: Not on file    Inability: Not on file  . Transportation needs    Medical: Not on file    Non-medical: Not on file  Tobacco Use  . Smoking status: Never Smoker  . Smokeless tobacco: Never Used  Substance and Sexual Activity  . Alcohol use: No    Frequency: Never  . Drug use: Never  . Sexual activity: Not on file  Lifestyle  . Physical activity    Days per week: Not on file    Minutes per session: Not on file  . Stress: Not on file  Relationships  . Social Herbalist on phone: Not on file    Gets together: Not on file    Attends religious service: Not on file    Active member of club or organization: Not on file    Attends meetings of clubs or organizations: Not on file    Relationship status: Not on file  Other Topics Concern  . Not on file  Social History Narrative  . Not on file        Objective:  Physical Exam: BP 116/72 (BP Location: Left Arm, Patient Position: Sitting, Cuff Size: Normal)   Pulse 83   Temp 98.3 F (36.8 C) (Oral)   Ht _0  (1.727 m)   Wt 201 lb (91.2 kg)   SpO2 94%   BMI 30.56 kg/m   Body mass index is 30.56 kg/m. Wt Readings from Last 3 Encounters:  05/21/19 201 lb (91.2 kg)  12/16/18 191 lb 6.1 oz (86.8 kg)  11/18/18 193 lb 3.2 oz (87.6 kg)   Gen: NAD, resting comfortably HEENT: TMs normal bilaterally. OP clear. No thyromegaly noted.  CV: RRR with no murmurs appreciated Pulm: NWOB, CTAB with no crackles, wheezes, or rhonchi GI: Normal bowel sounds present. Soft, Nontender, Nondistended. MSK: no edema,  cyanosis, or clubbing noted Skin: warm, dry Neuro: CN2-12 grossly intact. Strength 5/5 in upper and lower extremities. Reflexes symmetric and intact bilaterally.  Psych: Normal affect and thought content     Caleb M. Jerline Pain, MD 05/21/2019 10:49 AM

## 2019-05-21 NOTE — Assessment & Plan Note (Signed)
Stable.  Continue doxepin 6 mg nightly.

## 2019-05-25 LAB — HEPATITIS C ANTIBODY
Hepatitis C Ab: NONREACTIVE
SIGNAL TO CUT-OFF: 0.04 (ref <1.00)

## 2019-05-26 ENCOUNTER — Encounter: Payer: Self-pay | Admitting: Family Medicine

## 2019-05-26 DIAGNOSIS — R739 Hyperglycemia, unspecified: Secondary | ICD-10-CM | POA: Insufficient documentation

## 2019-05-26 DIAGNOSIS — E785 Hyperlipidemia, unspecified: Secondary | ICD-10-CM | POA: Insufficient documentation

## 2019-05-26 NOTE — Progress Notes (Signed)
Please inform patient of the following:  His thyroid test is off a little bit.  Please ask him to come back in the next week or 2 to recheck.  Please place future order for TSH.  Free T4.  His blood sugar and cholesterol levels are borderline.  Do not need to start any medications.  Recommend he continue to work on diet and exercise and we can recheck in a year.  All of his other labs are normal.  [place] Jerline Pain, MD 05/26/2019 1:09 PM

## 2019-05-28 ENCOUNTER — Other Ambulatory Visit: Payer: Self-pay

## 2019-05-28 DIAGNOSIS — R7989 Other specified abnormal findings of blood chemistry: Secondary | ICD-10-CM

## 2019-06-09 ENCOUNTER — Other Ambulatory Visit (INDEPENDENT_AMBULATORY_CARE_PROVIDER_SITE_OTHER): Payer: BC Managed Care – PPO

## 2019-06-09 ENCOUNTER — Other Ambulatory Visit: Payer: Self-pay

## 2019-06-09 DIAGNOSIS — R7989 Other specified abnormal findings of blood chemistry: Secondary | ICD-10-CM

## 2019-06-09 LAB — T4, FREE: Free T4: 0.91 ng/dL (ref 0.60–1.60)

## 2019-06-09 LAB — TSH: TSH: 10.18 u[IU]/mL — ABNORMAL HIGH (ref 0.35–4.50)

## 2019-06-11 NOTE — Progress Notes (Signed)
Please inform patient of the following:  His thyroid levels are stable, but still borderline low. Recommend scheduling an OV (ok with virtual) to discuss pros/cons of possibly starting thyroid medication. I am also ok with rechecking in 6-12 months if he knows that he does not want to start a medication at this time.  Algis Greenhouse. Jerline Pain, MD 06/11/2019 8:11 PM

## 2019-06-22 ENCOUNTER — Ambulatory Visit (INDEPENDENT_AMBULATORY_CARE_PROVIDER_SITE_OTHER): Payer: BC Managed Care – PPO | Admitting: Family Medicine

## 2019-06-22 ENCOUNTER — Other Ambulatory Visit: Payer: Self-pay

## 2019-06-22 ENCOUNTER — Encounter: Payer: Self-pay | Admitting: Family Medicine

## 2019-06-22 VITALS — BP 110/70 | HR 72 | Temp 98.2°F | Ht 68.0 in | Wt 200.4 lb

## 2019-06-22 DIAGNOSIS — D72819 Decreased white blood cell count, unspecified: Secondary | ICD-10-CM

## 2019-06-22 DIAGNOSIS — E039 Hypothyroidism, unspecified: Secondary | ICD-10-CM

## 2019-06-22 DIAGNOSIS — E038 Other specified hypothyroidism: Secondary | ICD-10-CM

## 2019-06-22 MED ORDER — LEVOTHYROXINE SODIUM 25 MCG PO TABS: 25.0000 ug | ORAL_TABLET | Freq: Every day | ORAL | 3 refills | Status: DC

## 2019-06-22 NOTE — Progress Notes (Signed)
   Chief Complaint:  Glenn Kirby is a 58 y.o. male who presents today with a chief complaint of abnormal TSH.   Assessment/Plan:   Subclinical hypothyroidism Discussed pros and cons of starting levothyroxine. Will start levothyroxine 25 mcg daily.  Discussed potential side effects.  Follow-up with me in 6 weeks to recheck TSH.  Leukopenia Check CBC with differential at next blood draw.    Subjective:  HPI:  Abnormal TSH  Found on lab work over the past month with TSH 9.7 to 10.18 and T4 within normal ranges.Has no reported symptoms. Has mild fatigue.  No reported constipation.  No reported hair skin changes.  Leukopenia Also incidentally noted on labs. Has typically low WBCs.   ROS: Per HPI  PMH: He reports that he has never smoked. He has never used smokeless tobacco. He reports that he does not drink alcohol or use drugs.      Objective:  Physical Exam: BP 110/70 (BP Location: Left Arm, Patient Position: Sitting, Cuff Size: Normal)   Pulse 72   Temp 98.2 F (36.8 C) (Oral)   Ht 5\' 8"  (1.727 m)   Wt 200 lb 6.1 oz (90.9 kg)   SpO2 97%   BMI 30.47 kg/m   Gen: NAD, resting comfortably Neuro: Grossly normal, moves all extremities Psych: Normal affect and thought content  Time Spent: I spent 15 minutes face-to-face with the patient, with more than half spent on counseling for management plan for his subclinical hypothyroidism and leukopenia.      Algis Greenhouse. Jerline Pain, MD 06/22/2019 9:18 AM

## 2019-06-22 NOTE — Patient Instructions (Signed)
It was very nice to see you today!  We will start thyroid medication today.  Come back in 6 weeks to have your blood work rechecked.  Let me know if you have any side effects to the medication.  Take care, Dr Jerline Pain  Please try these tips to maintain a healthy lifestyle:   Eat at least 3 REAL meals and 1-2 snacks per day.  Aim for no more than 5 hours between eating.  If you eat breakfast, please do so within one hour of getting up.    Obtain twice as many fruits/vegetables as protein or carbohydrate foods for both lunch and dinner. (Half of each meal should be fruits/vegetables, one quarter protein, and one quarter starchy carbs)   Cut down on sweet beverages. This includes juice, soda, and sweet tea.    Exercise at least 150 minutes every week.

## 2019-06-22 NOTE — Assessment & Plan Note (Signed)
Discussed pros and cons of starting levothyroxine. Will start levothyroxine 25 mcg daily.  Discussed potential side effects.  Follow-up with me in 6 weeks to recheck TSH.

## 2019-06-22 NOTE — Assessment & Plan Note (Signed)
Check CBC with differential at next blood draw.

## 2019-07-20 ENCOUNTER — Other Ambulatory Visit (INDEPENDENT_AMBULATORY_CARE_PROVIDER_SITE_OTHER): Payer: BC Managed Care – PPO

## 2019-07-20 DIAGNOSIS — E039 Hypothyroidism, unspecified: Secondary | ICD-10-CM

## 2019-07-20 DIAGNOSIS — D72819 Decreased white blood cell count, unspecified: Secondary | ICD-10-CM | POA: Diagnosis not present

## 2019-07-20 DIAGNOSIS — E038 Other specified hypothyroidism: Secondary | ICD-10-CM

## 2019-07-20 LAB — CBC WITH DIFFERENTIAL/PLATELET
Basophils Absolute: 0 10*3/uL (ref 0.0–0.1)
Basophils Relative: 0.3 % (ref 0.0–3.0)
Eosinophils Absolute: 0 10*3/uL (ref 0.0–0.7)
Eosinophils Relative: 0.9 % (ref 0.0–5.0)
HCT: 45.8 % (ref 39.0–52.0)
Hemoglobin: 15.5 g/dL (ref 13.0–17.0)
Lymphocytes Relative: 48 % — ABNORMAL HIGH (ref 12.0–46.0)
Lymphs Abs: 1.6 10*3/uL (ref 0.7–4.0)
MCHC: 33.7 g/dL (ref 30.0–36.0)
MCV: 88.2 fl (ref 78.0–100.0)
Monocytes Absolute: 0.4 10*3/uL (ref 0.1–1.0)
Monocytes Relative: 12.2 % — ABNORMAL HIGH (ref 3.0–12.0)
Neutro Abs: 1.3 10*3/uL — ABNORMAL LOW (ref 1.4–7.7)
Neutrophils Relative %: 38.6 % — ABNORMAL LOW (ref 43.0–77.0)
Platelets: 266 10*3/uL (ref 150.0–400.0)
RBC: 5.19 Mil/uL (ref 4.22–5.81)
RDW: 13.2 % (ref 11.5–15.5)
WBC: 3.4 10*3/uL — ABNORMAL LOW (ref 4.0–10.5)

## 2019-07-20 LAB — TSH: TSH: 11.62 u[IU]/mL — ABNORMAL HIGH (ref 0.35–4.50)

## 2019-07-20 NOTE — Progress Notes (Signed)
Please inform patient of the following:  His thyroid levels have slightly worsened. Blood counts are stable.  Please ask him to schedule OV soon to discuss treatment for his thyroid levels.  Glenn Kirby. Jerline Pain, MD 07/20/2019 4:10 PM

## 2019-08-03 ENCOUNTER — Encounter: Payer: Self-pay | Admitting: Family Medicine

## 2019-08-03 ENCOUNTER — Ambulatory Visit: Payer: BC Managed Care – PPO | Admitting: Family Medicine

## 2019-08-03 ENCOUNTER — Other Ambulatory Visit: Payer: Self-pay

## 2019-08-03 VITALS — BP 108/72 | HR 72 | Temp 97.9°F | Ht 68.0 in | Wt 199.5 lb

## 2019-08-03 DIAGNOSIS — E038 Other specified hypothyroidism: Secondary | ICD-10-CM

## 2019-08-03 DIAGNOSIS — E039 Hypothyroidism, unspecified: Secondary | ICD-10-CM

## 2019-08-03 MED ORDER — LEVOTHYROXINE SODIUM 50 MCG PO TABS: 50.0000 ug | ORAL_TABLET | Freq: Every day | ORAL | 1 refills | Status: DC

## 2019-08-03 NOTE — Progress Notes (Signed)
   Chief Complaint:  Glenn Kirby is a 58 y.o. male who presents today with a chief complaint of subclinical hypothyroidism.   Assessment/Plan:  Subclinical hypothyroidism TSH elevated to 11. Will increase levothyroxine to 40mcg daily. Check TSH in 4-6 weeks.     Subjective:  HPI:  Subclinical Hypothyroidism  On levothyroxine 25 mcg daily.  Last TSH 11.  Has not felt any change in starting levothyroxine about a month ago.  Is taking in the morning on empty stomach.  ROS: Per HPI  PMH: He reports that he has never smoked. He has never used smokeless tobacco. He reports that he does not drink alcohol or use drugs.      Objective:  Physical Exam: BP 108/72   Pulse 72   Temp 97.9 F (36.6 C)   Ht 5\' 8"  (1.727 m)   Wt 199 lb 8 oz (90.5 kg)   SpO2 97%   BMI 30.33 kg/m   Gen: NAD, resting comfortably     Imer Foxworth M. Jerline Pain, MD 08/03/2019 8:26 AM

## 2019-08-03 NOTE — Assessment & Plan Note (Signed)
TSH elevated to 11. Will increase levothyroxine to 87mcg daily. Check TSH in 4-6 weeks.

## 2019-08-03 NOTE — Patient Instructions (Signed)
It was very nice to see you today!  We will increase your levothyroxine to 50 mcg daily.  Please come back in 4 to 6 weeks to recheck your blood work.  Take care, Dr Jerline Pain  Please try these tips to maintain a healthy lifestyle:   Eat at least 3 REAL meals and 1-2 snacks per day.  Aim for no more than 5 hours between eating.  If you eat breakfast, please do so within one hour of getting up.    Obtain twice as many fruits/vegetables as protein or carbohydrate foods for both lunch and dinner. (Half of each meal should be fruits/vegetables, one quarter protein, and one quarter starchy carbs)   Cut down on sweet beverages. This includes juice, soda, and sweet tea.    Exercise at least 150 minutes every week.

## 2019-08-28 ENCOUNTER — Other Ambulatory Visit: Payer: Self-pay

## 2019-08-28 MED ORDER — DOXEPIN HCL 6 MG PO TABS: ORAL_TABLET | ORAL | 2 refills | Status: DC

## 2019-09-08 ENCOUNTER — Other Ambulatory Visit (INDEPENDENT_AMBULATORY_CARE_PROVIDER_SITE_OTHER): Payer: BC Managed Care – PPO

## 2019-09-08 ENCOUNTER — Other Ambulatory Visit: Payer: Self-pay

## 2019-09-08 DIAGNOSIS — E039 Hypothyroidism, unspecified: Secondary | ICD-10-CM | POA: Diagnosis not present

## 2019-09-08 DIAGNOSIS — E038 Other specified hypothyroidism: Secondary | ICD-10-CM

## 2019-09-08 LAB — TSH: TSH: 4.97 u[IU]/mL — ABNORMAL HIGH (ref 0.35–4.50)

## 2019-09-08 NOTE — Progress Notes (Signed)
Please inform patient of the following:  Thyroid levels are much better. Would like for him to continue current dose and we can recheck in 3-6 months.  Algis Greenhouse. Jerline Pain, MD 09/08/2019 12:25 PM

## 2019-10-19 ENCOUNTER — Encounter: Payer: Self-pay | Admitting: Family Medicine

## 2019-10-20 MED ORDER — CLORAZEPATE DIPOTASSIUM 3.75 MG PO TABS: 3.7500 mg | ORAL_TABLET | Freq: Two times a day (BID) | ORAL | 3 refills | Status: DC | PRN

## 2019-10-20 NOTE — Telephone Encounter (Signed)
Please advise 

## 2019-10-21 ENCOUNTER — Other Ambulatory Visit: Payer: Self-pay | Admitting: Family Medicine

## 2019-10-27 ENCOUNTER — Ambulatory Visit (INDEPENDENT_AMBULATORY_CARE_PROVIDER_SITE_OTHER): Payer: BC Managed Care – PPO | Admitting: Family Medicine

## 2019-10-27 VITALS — BP 123/83 | HR 85 | Temp 97.7°F | Ht 68.0 in | Wt 200.0 lb

## 2019-10-27 DIAGNOSIS — G4700 Insomnia, unspecified: Secondary | ICD-10-CM | POA: Diagnosis not present

## 2019-10-27 DIAGNOSIS — M542 Cervicalgia: Secondary | ICD-10-CM

## 2019-10-27 MED ORDER — MELOXICAM 15 MG PO TABS: 15.0000 mg | ORAL_TABLET | Freq: Every day | ORAL | 0 refills | Status: DC

## 2019-10-27 MED ORDER — DOXEPIN HCL 6 MG PO TABS: ORAL_TABLET | ORAL | 1 refills | Status: DC

## 2019-10-27 NOTE — Progress Notes (Signed)
   Chief Complaint:  Glenn Kirby is a 58 y.o. male who presents today with a chief complaint of Stress.   Assessment/Plan:  Insomnia We will refill doxepin.  Would consider switching to liquid formulation due to increasing cost of tablet.  He is not able to tolerate 10mg  dose -not a candidate for capsules.  Neck Pain Consistent with muscular strain.  No red flags.  Will start meloxicam 15 mg daily for the next 1 to 2 weeks.  Discussed home exercises and handout was given.    Subjective:  HPI:  Neck pain Patient has had neck pain for the past several weeks.  No obvious precipitating events though does note if he has been working more at his desk and thinks he has some neck strain.  He has tried home stretches and exercises with no significant improvement.  No other treatments tried.  No weakness or numbness.  Symptoms are overall stable.  Worse with certain motions.  Stress / Insomnia Patient currently on doxepin 6 mg nightly.  He was recently told by his insurance that he would have to start paying over $150 per month.  He would like an early refill today.  He has been tolerating well without side effects.  He has tried 10 mg in the past but did not tolerate due to side effects.  ROS: Per HPI  PMH: He reports that he has never smoked. He has never used smokeless tobacco. He reports that he does not drink alcohol or use drugs.      Objective:  Physical Exam: BP 123/83   Pulse 85   Temp 97.7 F (36.5 C)   Ht 5\' 8"  (1.727 m)   Wt 200 lb (90.7 kg)   SpO2 98%   BMI 30.41 kg/m   Gen: NAD, resting comfortably CV: Regular rate and rhythm with no murmurs appreciated Pulm: Normal work of breathing, clear to auscultation bilaterally with no crackles, wheezes, or rhonchi GI: Normal bowel sounds present. Soft, Nontender, Nondistended. MSK: No edema, cyanosis, or clubbing noted -Neck: No deformities.  Tender to palpation along left paraspinal muscle group.  Full range of motion  throughout. Skin: Warm, dry Neuro: Grossly normal, moves all extremities Psych: Normal affect and thought content      Caleb M. Jerline Pain, MD 10/27/2019 4:18 PM

## 2019-10-27 NOTE — Patient Instructions (Signed)
It was very nice to see you today!  I will send in your doxepin.  Next year, we may need to switch to the oral liquid.  Please try the meloxicam and stretches for you neck.  Let me know if not improving.  Take care, Dr Jerline Pain  Please try these tips to maintain a healthy lifestyle:   Eat at least 3 REAL meals and 1-2 snacks per day.  Aim for no more than 5 hours between eating.  If you eat breakfast, please do so within one hour of getting up.    Obtain twice as many fruits/vegetables as protein or carbohydrate foods for both lunch and dinner. (Half of each meal should be fruits/vegetables, one quarter protein, and one quarter starchy carbs)   Cut down on sweet beverages. This includes juice, soda, and sweet tea.    Exercise at least 150 minutes every week.

## 2019-10-27 NOTE — Assessment & Plan Note (Signed)
We will refill doxepin.  Would consider switching to liquid formulation due to increasing cost of tablet.  He is not able to tolerate 10mg  dose -not a candidate for capsules.

## 2019-11-22 ENCOUNTER — Other Ambulatory Visit: Payer: Self-pay | Admitting: Family Medicine

## 2019-12-20 ENCOUNTER — Other Ambulatory Visit: Payer: Self-pay | Admitting: Family Medicine

## 2019-12-31 ENCOUNTER — Ambulatory Visit: Payer: BC Managed Care – PPO | Attending: Family

## 2019-12-31 DIAGNOSIS — Z23 Encounter for immunization: Secondary | ICD-10-CM

## 2019-12-31 NOTE — Progress Notes (Signed)
   Covid-[facility] Vaccination [facility]  Name:  Glenn Kirby    MRN: [id number] DOB: 17-May-1961  12/31/2019  [name] was observed post Covid-19 immunization for 15 minutes without incidence. He was provided with Vaccine Information Sheet and instruction to access the V-Safe system.   [name] was instructed to call 911 with any severe reactions post vaccine: Marland Kitchen Difficulty breathing  . Swelling of your face and throat  . A fast heartbeat  . A bad rash all over your body  . Dizziness and weakness    Immunizations Administered    Name Date Dose VIS Date Route   Moderna COVID-19 Vaccine 12/31/2019  4:21 PM 0.5 mL 10/20/2019 Intramuscular   Manufacturer: Moderna   Lot: [id number]   NDC: 24401-027-25

## 2020-01-18 ENCOUNTER — Emergency Department (HOSPITAL_COMMUNITY): Payer: BC Managed Care – PPO

## 2020-01-18 ENCOUNTER — Telehealth: Payer: Self-pay | Admitting: Family Medicine

## 2020-01-18 ENCOUNTER — Emergency Department (HOSPITAL_COMMUNITY)
Admission: EM | Admit: 2020-01-18 | Discharge: 2020-01-18 | Disposition: A | Discharge status: Home / Self Care | Payer: BC Managed Care – PPO | Attending: Emergency Medicine | Admitting: Emergency Medicine

## 2020-01-18 DIAGNOSIS — R42 Dizziness and giddiness: Secondary | ICD-10-CM | POA: Diagnosis present

## 2020-01-18 LAB — CBC WITH DIFFERENTIAL/PLATELET
Abs Immature Granulocytes: 0.02 10*3/uL (ref 0.00–0.07)
Basophils Absolute: 0 10*3/uL (ref 0.0–0.1)
Basophils Relative: 0 %
Eosinophils Absolute: 0 10*3/uL (ref 0.0–0.5)
Eosinophils Relative: 0 %
HCT: 47.8 % (ref 39.0–52.0)
Hemoglobin: 16 g/dL (ref 13.0–17.0)
Immature Granulocytes: 1 %
Lymphocytes Relative: 22 %
Lymphs Abs: 0.8 10*3/uL (ref 0.7–4.0)
MCH: 29.9 pg (ref 26.0–34.0)
MCHC: 33.5 g/dL (ref 30.0–36.0)
MCV: 89.3 fL (ref 80.0–100.0)
Monocytes Absolute: 0.3 10*3/uL (ref 0.1–1.0)
Monocytes Relative: 8 %
Neutro Abs: 2.6 10*3/uL (ref 1.7–7.7)
Neutrophils Relative %: 69 %
Platelets: 225 10*3/uL (ref 150–400)
RBC: 5.35 MIL/uL (ref 4.22–5.81)
RDW: 12.4 % (ref 11.5–15.5)
WBC: 3.8 10*3/uL — ABNORMAL LOW (ref 4.0–10.5)
nRBC: 0 % (ref 0.0–0.2)

## 2020-01-18 LAB — BASIC METABOLIC PANEL
Anion gap: 8 (ref 5–15)
BUN: 8 mg/dL (ref 6–20)
CO2: 27 mmol/L (ref 22–32)
Calcium: 9.4 mg/dL (ref 8.9–10.3)
Chloride: 103 mmol/L (ref 98–111)
Creatinine, Ser: 0.91 mg/dL (ref 0.61–1.24)
GFR calc Af Amer: >60 mL/min (ref >60)
GFR calc non Af Amer: >60 mL/min (ref >60)
Glucose, Bld: 111 mg/dL — ABNORMAL HIGH (ref 70–99)
Potassium: 4.3 mmol/L (ref 3.5–5.1)
Sodium: 138 mmol/L (ref 135–145)

## 2020-01-18 MED ORDER — MECLIZINE HCL 25 MG PO TABS
25.0000 mg | ORAL_TABLET | Freq: Once | ORAL | Status: AC
  Administered 2020-01-18: 25 mg via ORAL
  Filled 2020-01-18: qty 1

## 2020-01-18 MED ORDER — SODIUM CHLORIDE 0.9 % IV BOLUS
1000.0000 mL | Freq: Once | INTRAVENOUS | Status: AC
  Administered 2020-01-18: 1000 mL via INTRAVENOUS

## 2020-01-18 MED ORDER — MECLIZINE HCL 25 MG PO TABS: 25.0000 mg | ORAL_TABLET | Freq: Three times a day (TID) | ORAL | 0 refills | Status: DC | PRN

## 2020-01-18 MED ORDER — LORAZEPAM 2 MG/ML IJ SOLN
2.0000 mg | Freq: Once | INTRAMUSCULAR | Status: AC | PRN
  Administered 2020-01-18: 13:00:00 2 mg via INTRAVENOUS
  Filled 2020-01-18 (×2): qty 1

## 2020-01-18 NOTE — ED Notes (Signed)
Pt discharge, prescription, and follow up education provided. Pt verbalizes understanding. Pt is alert and oriented x 4 at discharge.

## 2020-01-18 NOTE — ED Provider Notes (Signed)
[facility] EMERGENCY DEPARTMENT Provider Note   CSN: [id number] Arrival date & time: 01/18/20  5701     History Chief Complaint  Patient presents with  . Dizziness    Glenn Kirby is a 59 y.o. male.  HPI 59 year old male presents with acute dizziness.  Started when he first woke up at 2 AM to go to the bathroom.  Everything was spinning.  Eventually he was able to fall back asleep while sitting up.  At 7 AM he woke up and was still dizzy.  Since being in the emergency department he feels better though it lasted an hour or more.  He felt like his ear was hurting the other day but not now.  A little bit of a mild to moderate headache in the front.  No vision changes, weakness or numbness.  He has had some nausea.  He can walk but feels off balance  No past medical history on file.  Patient Active Problem List   Diagnosis Date Noted  . Subclinical hypothyroidism 06/22/2019  . Leukopenia 06/22/2019  . Dyslipidemia 05/26/2019  . Hyperglycemia 05/26/2019  . Insomnia 02/17/2019  . Stress 10/31/2018    Past Surgical History:  Procedure Laterality Date  . LEFT HEART CATHETERIZATION WITH CORONARY ANGIOGRAM N/A 05/12/2013   Procedure: LEFT HEART CATHETERIZATION WITH CORONARY ANGIOGRAM;  Surgeon: Robynn Pane, MD;  Location: [facility] CATH LAB;  Service: Cardiovascular;  Laterality: N/A;       Family History  Problem Relation Age of Onset  . Cancer Neg Hx     Social History   Tobacco Use  . Smoking status: Never Smoker  . Smokeless tobacco: Never Used  Substance Use Topics  . Alcohol use: No  . Drug use: Never    Home Medications Prior to Admission medications   Medication Sig Start Date End Date Taking? Authorizing Provider  clorazepate (TRANXENE) 3.75 MG tablet Take 1 tablet (3.75 mg total) by mouth 2 (two) times daily as needed for anxiety. 10/20/19   Ardith Dark, MD  Diclofenac Sodium (PENNSAID) 2 % SOLN Place 1 application onto the skin 2 (two)  times daily. 11/18/18   Andrena Mews, DO  Doxepin HCl 6 MG TABS Take 1 tab 30 minutes before bed. 10/27/19   Ardith Dark, MD  levothyroxine (SYNTHROID) 50 MCG tablet Take 1 tablet (50 mcg total) by mouth daily before breakfast. 08/03/19   Ardith Dark, MD  meclizine (ANTIVERT) 25 MG tablet Take 1 tablet (25 mg total) by mouth 3 (three) times daily as needed for dizziness. 01/18/20   Pricilla Loveless, MD  meloxicam (MOBIC) 15 MG tablet TAKE 1 TABLET(15 MG) BY MOUTH DAILY 12/21/19   Ardith Dark, MD    Allergies    Patient has no known allergies.  Review of Systems   Review of Systems  Eyes: Negative for visual disturbance.  Cardiovascular: Negative for chest pain.  Gastrointestinal: Positive for nausea. Negative for vomiting.  Neurological: Positive for dizziness and headaches. Negative for weakness and numbness.  All other systems reviewed and are negative.   Physical Exam Updated Vital Signs BP 115/84   Pulse 75   Temp 98.1 F (36.7 C) (Oral)   Resp 13   SpO2 99%   Physical Exam Vitals and nursing note reviewed.  Constitutional:      Appearance: He is well-developed.  HENT:     Head: Normocephalic and atraumatic.     Right Ear: Tympanic membrane and external ear normal.  Left Ear: Tympanic membrane and external ear normal.     Nose: Nose normal.  Eyes:     General:        Right eye: No discharge.        Left eye: No discharge.     Extraocular Movements: Extraocular movements intact.     Right eye: No nystagmus.     Left eye: No nystagmus.     Pupils: Pupils are equal, round, and reactive to light.  Cardiovascular:     Rate and Rhythm: Normal rate and regular rhythm.     Heart sounds: Normal heart sounds.  Pulmonary:     Effort: Pulmonary effort is normal.     Breath sounds: Normal breath sounds.  Abdominal:     Palpations: Abdomen is soft.     Tenderness: There is no abdominal tenderness.  Musculoskeletal:     Cervical back: Neck supple.  Skin:     General: Skin is warm and dry.  Neurological:     Mental Status: He is alert.     Comments: CN 3-12 grossly intact. 5/5 strength in all 4 extremities. Grossly normal sensation. Normal finger to nose. Able to walk but walks slowly, states he feels mildly dizzy  Psychiatric:        Mood and Affect: Mood is not anxious.     ED Results / Procedures / Treatments   Labs (all labs ordered are listed, but only abnormal results are displayed) Labs Reviewed  BASIC METABOLIC PANEL - Abnormal; Notable for the following components:      Result Value   Glucose, Bld 111 (*)    All other components within normal limits  CBC WITH DIFFERENTIAL/PLATELET - Abnormal; Notable for the following components:   WBC 3.8 (*)    All other components within normal limits    EKG None  Radiology MR BRAIN WO CONTRAST  Result Date: 01/18/2020 CLINICAL DATA:  Ataxia. Stroke suspected EXAM: MRI HEAD WITHOUT CONTRAST TECHNIQUE: Multiplanar, multiecho pulse sequences of the brain and surrounding structures were obtained without intravenous contrast. COMPARISON:  None. FINDINGS: Brain: No acute infarction, hemorrhage, extra-axial collection or mass lesion. Rare scattered foci of T2 hyperintensity are seen in the white matter of the cerebral hemispheres, nonspecific, most likely related to chronic small vessel ischemia. Prominence of the supratentorial ventricles and cerebral sulci, reflecting parenchymal volume loss, more pronounced than expected for age. Vascular: Normal flow voids. Skull and upper cervical spine: Normal marrow signal. Sinuses/Orbits: Mucous retention cysts are seen in the bilateral maxillary sinuses. There is mild mucosal thickening of the bilateral frontal sinuses and ethmoid cells. Other: None. IMPRESSION: 1. No acute intracranial abnormality. 2. Prominence of cerebral sulci and supratentorial ventricular system, more advanced than expected for age. Electronically Signed   By: Pedro Earls  M.D.   On: 01/18/2020 14:21    Procedures Procedures (including critical care time)  Medications Ordered in ED Medications  sodium chloride 0.9 % bolus 1,000 mL (1,000 mLs Intravenous New Bag/Given 01/18/20 1039)  meclizine (ANTIVERT) tablet 25 mg (25 mg Oral Given 01/18/20 1020)  LORazepam (ATIVAN) injection 2 mg (2 mg Intravenous Given 01/18/20 1326)    ED Course  I have reviewed the triage vital signs and the nursing notes.  Pertinent labs & imaging results that were available during my care of the patient were reviewed by me and considered in my medical decision making (see chart for details).    MDM Rules/Calculators/A&P  Patient is feeling better. Given sudden onset with prolonged symptoms, MRI ordered. MRI is negative. Noted some ear complaints a few days ago. Will refer to ENT. Will give meclizine Rx. Will d/c Final Clinical Impression(s) / ED Diagnoses Final diagnoses:  Vertigo    Rx / DC Orders ED Discharge Orders         Ordered    meclizine (ANTIVERT) 25 MG tablet  3 times daily PRN     01/18/20 1600           Pricilla Loveless, MD 01/18/20 1604

## 2020-01-18 NOTE — Telephone Encounter (Signed)
Patients co-worker called in on behalf of the patient wanting to schedule him for an appointment today for really bad vertigo, tired calling patient to have him triaged but the phone is turned off.

## 2020-01-18 NOTE — Discharge Instructions (Signed)
If you develop new or worsening headache, vision changes, uncontrolled dizziness, vomiting, weakness or numbness in your extremities, changes in speech or swallowing, or any other new/concerning symptoms and call 911 or return to the ER.

## 2020-01-18 NOTE — ED Triage Notes (Signed)
Pt arrives by pov from [facility] with c/o of dizziness and nausea that began at 2am when he woke to go to the bathroom. Pt reporting going to bed at 11pm. Pt has no weakness or trouble walking. LVO-. Pt alert and ox4. Clear speech.

## 2020-01-18 NOTE — Telephone Encounter (Signed)
Spoke with patient he stated that he is being seen at ED

## 2020-01-23 ENCOUNTER — Other Ambulatory Visit: Payer: Self-pay | Admitting: Family Medicine

## 2020-02-02 ENCOUNTER — Ambulatory Visit: Payer: BC Managed Care – PPO | Attending: Family

## 2020-02-02 DIAGNOSIS — Z23 Encounter for immunization: Secondary | ICD-10-CM

## 2020-02-02 NOTE — Progress Notes (Signed)
   Covid-19 Vaccination Clinic  Name:  Glenn Kirby    MRN: [id number] DOB: 09-13-1961  02/02/2020  [name] was observed post Covid-19 immunization for 15 minutes without incident. He was provided with Vaccine Information Sheet and instruction to access the V-Safe system.   [name] was instructed to call 911 with any severe reactions post vaccine: Marland Kitchen Difficulty breathing  . Swelling of face and throat  . A fast heartbeat  . A bad rash all over body  . Dizziness and weakness   Immunizations Administered    Name Date Dose VIS Date Route   Moderna COVID-19 Vaccine 02/02/2020 12:35 PM 0.5 mL 10/20/2019 Intramuscular   Manufacturer: Moderna   Lot: [id number]   NDC: 54656-812-75

## 2020-02-24 ENCOUNTER — Other Ambulatory Visit: Payer: Self-pay | Admitting: Family Medicine

## 2020-08-26 ENCOUNTER — Encounter: Payer: Self-pay | Admitting: Family Medicine

## 2020-08-26 ENCOUNTER — Other Ambulatory Visit: Payer: Self-pay

## 2020-08-26 ENCOUNTER — Ambulatory Visit (INDEPENDENT_AMBULATORY_CARE_PROVIDER_SITE_OTHER): Payer: BC Managed Care – PPO | Admitting: Family Medicine

## 2020-08-26 VITALS — BP 142/87 | HR 71 | Temp 98.0°F | Ht 68.0 in | Wt 198.2 lb

## 2020-08-26 DIAGNOSIS — F439 Reaction to severe stress, unspecified: Secondary | ICD-10-CM

## 2020-08-26 DIAGNOSIS — G4700 Insomnia, unspecified: Secondary | ICD-10-CM | POA: Diagnosis not present

## 2020-08-26 DIAGNOSIS — M545 Low back pain, unspecified: Secondary | ICD-10-CM

## 2020-08-26 DIAGNOSIS — Z23 Encounter for immunization: Secondary | ICD-10-CM

## 2020-08-26 LAB — COMPREHENSIVE METABOLIC PANEL
AG Ratio: 1.5 (calc) (ref 1.0–2.5)
ALT: 18 U/L (ref 9–46)
AST: 18 U/L (ref 10–35)
Albumin: 4.3 g/dL (ref 3.6–5.1)
Alkaline phosphatase (APISO): 69 U/L (ref 35–144)
BUN: 10 mg/dL (ref 7–25)
CO2: 31 mmol/L (ref 20–32)
Calcium: 10.1 mg/dL (ref 8.6–10.3)
Chloride: 101 mmol/L (ref 98–110)
Creat: 0.94 mg/dL (ref 0.70–1.33)
Globulin: 2.9 g/dL (calc) (ref 1.9–3.7)
Glucose, Bld: 90 mg/dL (ref 65–99)
Potassium: 4.4 mmol/L (ref 3.5–5.3)
Sodium: 138 mmol/L (ref 135–146)
Total Bilirubin: 0.7 mg/dL (ref 0.2–1.2)
Total Protein: 7.2 g/dL (ref 6.1–8.1)

## 2020-08-26 LAB — CBC
HCT: 46.3 % (ref 38.5–50.0)
Hemoglobin: 16.1 g/dL (ref 13.2–17.1)
MCH: 30.1 pg (ref 27.0–33.0)
MCHC: 34.8 g/dL (ref 32.0–36.0)
MCV: 86.7 fL (ref 80.0–100.0)
MPV: 9.2 fL (ref 7.5–12.5)
Platelets: 283 10*3/uL (ref 140–400)
RBC: 5.34 10*6/uL (ref 4.20–5.80)
RDW: 13.1 % (ref 11.0–15.0)
WBC: 3.4 10*3/uL — ABNORMAL LOW (ref 3.8–10.8)

## 2020-08-26 LAB — TESTOSTERONE: Testosterone: 525 ng/dL (ref 250–827)

## 2020-08-26 LAB — TSH: TSH: 2.41 mIU/L (ref 0.40–4.50)

## 2020-08-26 MED ORDER — CLORAZEPATE DIPOTASSIUM 3.75 MG PO TABS: 3.7500 mg | ORAL_TABLET | Freq: Two times a day (BID) | ORAL | 3 refills | Status: DC | PRN

## 2020-08-26 MED ORDER — MELOXICAM 15 MG PO TABS: ORAL_TABLET | ORAL | 0 refills | Status: DC

## 2020-08-26 NOTE — Assessment & Plan Note (Signed)
Unfortunately stress has worsened significantly since her last visit.  I will refill Tranxene today.  Discussed behavioral health referral and handout was given.  Patient does not want to start any other medications at this point.

## 2020-08-26 NOTE — Patient Instructions (Addendum)
It was very nice to see you today!  You have a pinched nerve in your back.  This should improve with treatment.  Please start meloxicam and take 1 pill daily for the next 1 to 2 weeks I will also place a referral for you to see physical therapy.  I would like to work on Architectural technologist.  Please try to schedule appointment with the behavioral therapist if you are able to.  We will restart the Tranxene.  We will check blood work today to see if there are any causes for you not been able to lose weight.  We will give your flu vaccine today.  Take care, Dr [name]  Please try these tips to maintain a healthy lifestyle:   Eat at least 3 REAL meals and 1-2 snacks per day.  Aim for no more than 5 hours between eating.  If you eat breakfast, please do so within one hour of getting up.    Each meal should contain half fruits/vegetables, one quarter protein, and one quarter carbs (no bigger than a computer mouse)   Cut down on sweet beverages. This includes juice, soda, and sweet tea.     Drink at least 1 glass of water with each meal and aim for at least 8 glasses per day   Exercise at least 150 minutes every week.

## 2020-08-26 NOTE — Assessment & Plan Note (Signed)
Stable.  Continue doxepin. 

## 2020-08-26 NOTE — Progress Notes (Signed)
   Glenn Kirby is a 59 y.o. male who presents today for an office visit.  Assessment/Plan:  New/Acute Problems: Low Back Pain No red flags.  Will start meloxicam 15 mg daily.  Also placed referral to physical therapy as this has become a recurrent issue for him.  Chronic Problems Addressed Today: Insomnia Stable.  Continue doxepin.  Stress Unfortunately stress has worsened significantly since her last visit.  I will refill Tranxene today.  Discussed behavioral health referral and handout was given.  Patient does not want to start any other medications at this point.  Obesity Patient is interested in losing weight.  Very likely stress could be contributing.  Will check labs today including CBC, CMET, TSH, and testosterone.  Will treat above problems and readdress the next office visit.  Flu vaccine was given today.     Subjective:  HPI:  Patient here with right lower back pain for about a week.  No obvious injuries or precipitating events.  Symptoms occur randomly.  Pain will radiate into right lower leg.  No reported bowel or bladder incontinence.  No reported weakness or numbness.  Has also been under more stress recently.  He has not been taking Tranxene.       Objective:  Physical Exam: BP (!) 142/87   Pulse 71   Temp 98 F (36.7 C) (Temporal)   Ht 5\' 8"  (1.727 m)   Wt 198 lb 3.2 oz (89.9 kg)   SpO2 99%   BMI 30.14 kg/m   Gen: No acute distress, resting comfortably CV: Regular rate and rhythm with no murmurs appreciated Pulm: Normal work of breathing, clear to auscultation bilaterally with no crackles, wheezes, or rhonchi MSK: Back without deformities.  Tender to palpation along right lower lumbar paraspinal muscles.  Straight leg raise negative bilaterally.  Lower extremities will full range of motion and strength throughout. Neuro: Grossly normal, moves all extremities Psych: Normal affect and thought content      Glenn Kirby M. , MD 08/26/2020 1:53 PM

## 2020-08-30 NOTE — Progress Notes (Signed)
Please inform patient of the following:  Labs are all NORMAL. Do not need to make any chagnes to his treatment plan at this time. Would like for him to let us know if his back pain is not improving.  Katina Degree. [name], MD 08/30/2020 1:05 PM

## 2020-09-14 ENCOUNTER — Ambulatory Visit: Payer: BC Managed Care – PPO | Admitting: Physical Therapy

## 2020-09-16 ENCOUNTER — Ambulatory Visit: Payer: BC Managed Care – PPO | Admitting: Physician Assistant

## 2020-09-20 ENCOUNTER — Other Ambulatory Visit: Payer: Self-pay

## 2020-09-20 ENCOUNTER — Ambulatory Visit: Payer: BC Managed Care – PPO | Admitting: Physical Therapy

## 2020-09-20 ENCOUNTER — Other Ambulatory Visit: Payer: Self-pay | Admitting: Family Medicine

## 2020-09-20 ENCOUNTER — Encounter: Payer: Self-pay | Admitting: Physical Therapy

## 2020-09-20 DIAGNOSIS — M5441 Lumbago with sciatica, right side: Secondary | ICD-10-CM

## 2020-09-20 NOTE — Patient Instructions (Signed)
Access Code: [email or web address] URL: [email or web address] Date: 09/20/2020 Prepared by: Sedalia Muta  Exercises Supine Single Knee to Chest Stretch - 2 x daily - 3 reps - 30 hold Supine Lower Trunk Rotation - 2 x daily - 10 reps - 5 hold Supine Piriformis Stretch Pulling Heel to Hip - 2 x daily - 3 reps - 30 hold Seated Piriformis Stretch with Trunk Bend - 2 x daily - 3 reps - 30 hold Seated Hamstring Stretch - 2 x daily - 3 sets - 30 hold

## 2020-09-22 ENCOUNTER — Encounter: Payer: Self-pay | Admitting: Physical Therapy

## 2020-09-22 ENCOUNTER — Other Ambulatory Visit: Payer: Self-pay

## 2020-09-22 ENCOUNTER — Ambulatory Visit: Payer: BC Managed Care – PPO | Admitting: Physical Therapy

## 2020-09-22 DIAGNOSIS — M5441 Lumbago with sciatica, right side: Secondary | ICD-10-CM | POA: Diagnosis not present

## 2020-09-22 NOTE — Therapy (Signed)
[facility] [address] [place], [place], [address] Phone: [phone]   Fax:  [phone]  Physical Therapy Evaluation  Patient Details  Name: Glenn Kirby MRN: [id number] Date of Birth: July 13, 1961 Referring Provider (PT): caleb [name]   Encounter Date: 09/20/2020   PT End of Session - 09/22/20 1224    Visit Number 1    Number of Visits 12    Date for PT Re-Evaluation 11/01/20    Authorization Type [employer]    PT Start Time 0845    PT Stop Time 0925    PT Time Calculation (min) 40 min    Activity Tolerance Patient tolerated treatment well    Behavior During Therapy [facility] for tasks assessed/performed           History reviewed. No pertinent past medical history.  Past Surgical History:  Procedure Laterality Date  . LEFT HEART CATHETERIZATION WITH CORONARY ANGIOGRAM N/A 05/12/2013   Procedure: LEFT HEART CATHETERIZATION WITH CORONARY ANGIOGRAM;  Surgeon: Robynn Pane, MD;  Location: [facility] CATH LAB;  Service: Cardiovascular;  Laterality: N/A;    There were no vitals filed for this visit.    Subjective Assessment - 09/22/20 1223    Subjective Pt notes increased pain in low back on R. He has not had previous pain. States increased pain with sitting in work chair, sometimes radiates into bil thights (R >L). 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    Patient Stated Goals Decreased pain    Currently in Pain? Yes    Pain Score 7     Pain Location Back    Pain Orientation Right;Left    Pain Descriptors / Indicators Burning    Pain Type Acute pain    Pain Radiating Towards into bil thighs R>L    Pain Onset 1 to 4 weeks ago    Pain Frequency Intermittent    Aggravating Factors  sitting    Pain Relieving Factors walking              [facility] PT Assessment - 09/22/20 0001      Assessment   Medical Diagnosis Low Back Pain    Referring Provider (PT) caleb Parker    Prior Therapy no      Balance  Screen   Has the patient fallen in the past 6 months No      Prior Function   Level of Independence Independent      Cognition   Overall Cognitive Status Within Functional Limits for tasks assessed      Posture/Postural Control   Posture Comments fwd trunk lean in standing,       ROM / Strength   AROM / PROM / Strength AROM;Strength      AROM   AROM Assessment Site Lumbar    Lumbar Flexion min limitation    Lumbar Extension mod limitation/ pain    Lumbar - Right Side Bend mild limitatin/ pain    Lumbar - Left Side Bend [facility]      Strength   Overall Strength Comments Core: 3-/5;  Hips: 4/5       Palpation   Palpation comment Stiffness and soreness in central low thoracic and lumbar spine, soreness in R lumbar paraspinals, into R SI and R glute                       Objective measurements completed on examination: See above findings.  [facility] Adult PT Treatment/Exercise - 09/22/20 1325      Exercises   Exercises Lumbar      Lumbar Exercises: Stretches   Active Hamstring Stretch 3 reps;30 seconds    Lower Trunk Rotation 10 seconds;5 reps    Piriformis Stretch 3 reps;30 seconds    Piriformis Stretch Limitations supine                   PT Education - 09/22/20 1223    Education Details PT POC, exam findings, HEP    Person(s) Educated Patient    Methods Explanation;Demonstration;Tactile cues;Verbal cues;Handout    Comprehension Verbalized understanding;Returned demonstration;Verbal cues required;Tactile cues required;Need further instruction            PT Short Term Goals - 09/22/20 1330      PT SHORT TERM GOAL #1   Title Pt to be independent with initial HEP    Time 2    Period Weeks    Status New    Target Date 10/04/20      PT SHORT TERM GOAL #2   Title Pt to report decreased pain in back to 3/10 with sitting and activity             PT Long Term Goals - 09/22/20 1335      PT LONG TERM GOAL #1   Title Pt to be independent  with final HEP    Time 6    Period Weeks    Status New    Target Date 11/01/20      PT LONG TERM GOAL #2   Title Pt to demo improved pain in low back to 0-2/10 with sitting and activity    Time 6    Period Weeks    Status New    Target Date 11/01/20      PT LONG TERM GOAL #3   Title Pt to demo optimal mechanics for bend, lift, squat , for decreased pain with IADLS.    Time 6    Period Weeks    Status New    Target Date 11/01/20      PT LONG TERM GOAL #4   Title Pt to demo improved lumbar ROM to be [facility] and pain free for all  motions.    Time 6    Period Weeks    Status New    Target Date 11/01/20                  Plan - 09/22/20 1347    Clinical Impression Statement Pt presents with primary complaint of increased pain in low back. Pt with decreased lumbar ROM and increased pain with mobility. He has decreased posture, and decreased strength of hips and core. Pt with decreased ability for sitting, work duties, and IADLs. Pt to benefit from skilled PT to improve deficits and pain.    Examination-Activity Limitations Sit;Squat;Lift;Stand;Bend    Examination-Participation Restrictions Cleaning;Occupation;Community Activity;Shop    Stability/Clinical Decision Making Stable/Uncomplicated    Clinical Decision Making Low    Rehab Potential Good    PT Frequency 2x / week    PT Duration 6 weeks    PT Treatment/Interventions ADLs/Self Care Home Management;Cryotherapy;Electrical Stimulation;DME Instruction;Parrafin;Moist Heat;Traction;Iontophoresis 4mg /ml Dexamethasone;Stair training;Functional mobility training;Therapeutic activities;Therapeutic exercise;Neuromuscular re-education;Manual techniques;Patient/family education;Passive range of motion;Dry needling;Splinting;Taping;Joint Manipulations;Vasopneumatic Device;Spinal Manipulations    Consulted and Agree with Plan of Care Patient           Patient will benefit from skilled therapeutic intervention in order to improve the  following deficits and impairments:  Decreased range of motion, Increased muscle spasms, Decreased activity tolerance, Pain, Improper body mechanics, Impaired flexibility, Decreased mobility, Decreased strength  Visit Diagnosis: Acute bilateral low back pain with right-sided sciatica     Problem List Patient Active Problem List   Diagnosis Date Noted  . Subclinical hypothyroidism 06/22/2019  . Leukopenia 06/22/2019  . Dyslipidemia 05/26/2019  . Hyperglycemia 05/26/2019  . Insomnia 02/17/2019  . Stress 10/31/2018    Sedalia Muta 09/22/2020, 1:52 PM  [facility] [address] [place], [place], [address] Phone: [phone]   Fax:  [phone]  Name: Caelen Reierson MRN: [id number] Date of Birth: 1961/08/20

## 2020-09-22 NOTE — Therapy (Signed)
[facility] [address] [place], [place], [address] Phone: [phone]   Fax:  [phone]  Physical Therapy Treatment  Patient Details  Name: Markel Kurtenbach MRN: [id number] Date of Birth: Aug 16, 1961 Referring Provider (PT): caleb [name]   Encounter Date: 09/22/2020   PT End of Session - 09/22/20 1354    Visit Number 2    Number of Visits 12    Date for PT Re-Evaluation 11/01/20    Authorization Type BCBS    PT Start Time 1220    PT Stop Time 1300    PT Time Calculation (min) 40 min    Activity Tolerance Patient tolerated treatment well    Behavior During Therapy [facility] for tasks assessed/performed           No past medical history on file.  Past Surgical History:  Procedure Laterality Date  . LEFT HEART CATHETERIZATION WITH CORONARY ANGIOGRAM N/A 05/12/2013   Procedure: LEFT HEART CATHETERIZATION WITH CORONARY ANGIOGRAM;  Surgeon: Robynn Pane, MD;  Location: [facility] CATH LAB;  Service: Cardiovascular;  Laterality: N/A;    There were no vitals filed for this visit.   Subjective Assessment - 09/22/20 1354    Subjective Pt states back is feeling a little better, stretches feel good.    Currently in Pain? Yes    Pain Score 4     Pain Location Back    Pain Orientation Right;Left    Pain Descriptors / Indicators Aching    Pain Type Acute pain    Pain Onset 1 to 4 weeks ago    Pain Frequency Intermittent                             [facility] Adult PT Treatment/Exercise - 09/22/20 1357      Exercises   Exercises Lumbar      Lumbar Exercises: Stretches   Active Hamstring Stretch 3 reps;30 seconds    Lower Trunk Rotation 10 seconds;5 reps    Piriformis Stretch 3 reps;30 seconds    Piriformis Stretch Limitations supine     Other Lumbar Stretch Exercise Standing R QL stretch 30 sec x 3;     Other Lumbar Stretch Exercise seated rotation x 8;       Lumbar Exercises: Aerobic   Recumbent Bike L1 x 6 min;        Lumbar Exercises: Supine   Ab Set 20 reps    AB Set Limitations with education on correct performance    Bent Knee Raise 20 reps    Bent Knee Raise Limitations with TA    Straight Leg Raise 10 reps    Straight Leg Raises Limitations bil with TA      Manual Therapy   Manual Therapy Joint mobilization    Joint Mobilization PA mobs low thoracic and lumbar spine                     PT Short Term Goals - 09/22/20 1330      PT SHORT TERM GOAL #1   Title Pt to be independent with initial HEP    Time 2    Period Weeks    Status New    Target Date 10/04/20      PT SHORT TERM GOAL #2   Title Pt to report decreased pain in back to 3/10 with sitting and activity             PT Long  Term Goals - 09/22/20 1335      PT LONG TERM GOAL #1   Title Pt to be independent with final HEP    Time 6    Period Weeks    Status New    Target Date 11/01/20      PT LONG TERM GOAL #2   Title Pt to demo improved pain in low back to 0-2/10 with sitting and activity    Time 6    Period Weeks    Status New    Target Date 11/01/20      PT LONG TERM GOAL #3   Title Pt to demo optimal mechanics for bend, lift, squat , for decreased pain with IADLS.    Time 6    Period Weeks    Status New    Target Date 11/01/20      PT LONG TERM GOAL #4   Title Pt to demo improved lumbar ROM to be [facility] and pain free for all  motions.    Time 6    Period Weeks    Status New    Target Date 11/01/20                 Plan - 09/22/20 1355    Clinical Impression Statement Ther ex done for ROM and stretching today, as well as education on TA contraction. Pt will require continued practice for this. Discussed optimal seated and standing posture, pt with tendency for fwd lean. Plan to progress as tolerated.    Examination-Activity Limitations Sit;Squat;Lift;Stand;Bend    Examination-Participation Restrictions Cleaning;Occupation;Community Activity;Shop    Stability/Clinical Decision Making  Stable/Uncomplicated    Rehab Potential Good    PT Frequency 2x / week    PT Duration 6 weeks    PT Treatment/Interventions ADLs/Self Care Home Management;Cryotherapy;Electrical Stimulation;DME Instruction;Parrafin;Moist Heat;Traction;Iontophoresis 4mg /ml Dexamethasone;Stair training;Functional mobility training;Therapeutic activities;Therapeutic exercise;Neuromuscular re-education;Manual techniques;Patient/family education;Passive range of motion;Dry needling;Splinting;Taping;Joint Manipulations;Vasopneumatic Device;Spinal Manipulations    Consulted and Agree with Plan of Care Patient           Patient will benefit from skilled therapeutic intervention in order to improve the following deficits and impairments:  Decreased range of motion, Increased muscle spasms, Decreased activity tolerance, Pain, Improper body mechanics, Impaired flexibility, Decreased mobility, Decreased strength  Visit Diagnosis: Acute bilateral low back pain with right-sided sciatica     Problem List Patient Active Problem List   Diagnosis Date Noted  . Subclinical hypothyroidism 06/22/2019  . Leukopenia 06/22/2019  . Dyslipidemia 05/26/2019  . Hyperglycemia 05/26/2019  . Insomnia 02/17/2019  . Stress 10/31/2018    11/02/2018, PT, DPT 1:58 PM  09/22/20    [facility] [address] [place], [place], [place] Phone: [phone]   Fax:  [phone]  Name: Duell Holdren MRN: [name] Date of Birth: 1961-01-26

## 2020-09-28 ENCOUNTER — Ambulatory Visit: Payer: BC Managed Care – PPO | Admitting: Physical Therapy

## 2020-09-28 ENCOUNTER — Encounter: Payer: Self-pay | Admitting: Physical Therapy

## 2020-09-28 ENCOUNTER — Other Ambulatory Visit: Payer: Self-pay

## 2020-09-28 DIAGNOSIS — M5441 Lumbago with sciatica, right side: Secondary | ICD-10-CM | POA: Diagnosis not present

## 2020-09-28 NOTE — Therapy (Signed)
[facility] [address] [place], [place], [address] Phone: [phone]   Fax:  [phone]  Physical Therapy Treatment  Patient Details  Name: Glenn Kirby MRN: [id number] Date of Birth: 02-04-1961 Referring Provider (PT): caleb [name]   Encounter Date: 09/28/2020   PT End of Session - 09/28/20 1030    Visit Number 3    Number of Visits 12    Date for PT Re-Evaluation 11/01/20    Authorization Type BCBS    PT Start Time 1020    PT Stop Time 1102    PT Time Calculation (min) 42 min    Activity Tolerance Patient tolerated treatment well    Behavior During Therapy [facility] for tasks assessed/performed           History reviewed. No pertinent past medical history.  Past Surgical History:  Procedure Laterality Date   LEFT HEART CATHETERIZATION WITH CORONARY ANGIOGRAM N/A 05/12/2013   Procedure: LEFT HEART CATHETERIZATION WITH CORONARY ANGIOGRAM;  Surgeon: Robynn Pane, MD;  Location: [facility] CATH LAB;  Service: Cardiovascular;  Laterality: N/A;    There were no vitals filed for this visit.   Subjective Assessment - 09/28/20 1029    Subjective Pt states back is doing better. Still has soreness in mid back, but pain into thighs is much less/ gone. Has been doing HEP. Still feels most soreness at end of day/evening.    Currently in Pain? Yes    Pain Score 3     Pain Location Back    Pain Orientation Right;Left    Pain Descriptors / Indicators Aching    Pain Type Acute pain    Pain Onset More than a month ago    Pain Frequency Intermittent                             [facility] Adult PT Treatment/Exercise - 09/28/20 1028      Exercises   Exercises Lumbar      Lumbar Exercises: Stretches   Active Hamstring Stretch 3 reps;30 seconds    Lower Trunk Rotation 10 seconds;5 reps    Pelvic Tilt 10 reps    Piriformis Stretch --    Piriformis Stretch Limitations --    Other Lumbar Stretch Exercise --    Other Lumbar  Stretch Exercise --      Lumbar Exercises: Aerobic   Recumbent Bike L1 x 8 min;       Lumbar Exercises: Standing   Row 20 reps    Theraband Level (Row) Level 3 (Green)      Lumbar Exercises: Supine   Ab Set 20 reps    AB Set Limitations with education on correct performance    Bent Knee Raise 20 reps    Bent Knee Raise Limitations with TA    Bridge 15 reps    Straight Leg Raise 10 reps    Straight Leg Raises Limitations pain       Lumbar Exercises: Quadruped   [place]/Old Horse 20 reps    Other Quadruped Lumbar Exercises Childs pose 30 sec x 2 l, r ,center       Manual Therapy   Manual Therapy Joint mobilization    Joint Mobilization Long leg distraction bil x 2 min for lumbar pump                     PT Short Term Goals - 09/22/20 1330      PT  SHORT TERM GOAL #1   Title Pt to be independent with initial HEP    Time 2    Period Weeks    Status New    Target Date 10/04/20      PT SHORT TERM GOAL #2   Title Pt to report decreased pain in back to 3/10 with sitting and activity             PT Long Term Goals - 09/22/20 1335      PT LONG TERM GOAL #1   Title Pt to be independent with final HEP    Time 6    Period Weeks    Status New    Target Date 11/01/20      PT LONG TERM GOAL #2   Title Pt to demo improved pain in low back to 0-2/10 with sitting and activity    Time 6    Period Weeks    Status New    Target Date 11/01/20      PT LONG TERM GOAL #3   Title Pt to demo optimal mechanics for bend, lift, squat , for decreased pain with IADLS.    Time 6    Period Weeks    Status New    Target Date 11/01/20      PT LONG TERM GOAL #4   Title Pt to demo improved lumbar ROM to be [facility] and pain free for all  motions.    Time 6    Period Weeks    Status New    Target Date 11/01/20                 Plan - 09/28/20 1156    Clinical Impression Statement Pt with improving lumbar ROM, slight pain with R SB, but extension better today. Pt  progressing well with ther ex, does have difficulty with PPT, would be helpful to continue practice with this, as pt has increased lordosis in standing. Pain in LEs improving, plan to continue ther ex and manual as tolerated .    Examination-Activity Limitations Sit;Squat;Lift;Stand;Bend    Examination-Participation Restrictions Cleaning;Occupation;Community Activity;Shop    Stability/Clinical Decision Making Stable/Uncomplicated    Rehab Potential Good    PT Frequency 2x / week    PT Duration 6 weeks    PT Treatment/Interventions ADLs/Self Care Home Management;Cryotherapy;Electrical Stimulation;DME Instruction;Parrafin;Moist Heat;Traction;Iontophoresis 4mg /ml Dexamethasone;Stair training;Functional mobility training;Therapeutic activities;Therapeutic exercise;Neuromuscular re-education;Manual techniques;Patient/family education;Passive range of motion;Dry needling;Splinting;Taping;Joint Manipulations;Vasopneumatic Device;Spinal Manipulations    Consulted and Agree with Plan of Care Patient           Patient will benefit from skilled therapeutic intervention in order to improve the following deficits and impairments:  Decreased range of motion, Increased muscle spasms, Decreased activity tolerance, Pain, Improper body mechanics, Impaired flexibility, Decreased mobility, Decreased strength  Visit Diagnosis: Acute bilateral low back pain with right-sided sciatica     Problem List Patient Active Problem List   Diagnosis Date Noted   Subclinical hypothyroidism 06/22/2019   Leukopenia 06/22/2019   Dyslipidemia 05/26/2019   Hyperglycemia 05/26/2019   Insomnia 02/17/2019   Stress 10/31/2018   11/02/2018, PT, DPT 11:59 AM  09/28/20    [facility] [address] [place], [place], [place] Phone: [phone]   Fax:  [phone]  Name: Glenn Kirby MRN: [name] Date of Birth: Dec 17, 1960

## 2020-10-03 ENCOUNTER — Encounter: Payer: BC Managed Care – PPO | Admitting: Physical Therapy

## 2021-01-02 ENCOUNTER — Other Ambulatory Visit: Payer: Self-pay

## 2021-01-02 ENCOUNTER — Encounter: Payer: Self-pay | Admitting: Family Medicine

## 2021-01-02 ENCOUNTER — Ambulatory Visit (INDEPENDENT_AMBULATORY_CARE_PROVIDER_SITE_OTHER): Payer: BC Managed Care – PPO | Admitting: Family Medicine

## 2021-01-02 VITALS — BP 132/81 | HR 68 | Temp 97.7°F | Ht 68.0 in | Wt 197.0 lb

## 2021-01-02 DIAGNOSIS — R1011 Right upper quadrant pain: Secondary | ICD-10-CM

## 2021-01-02 DIAGNOSIS — G4700 Insomnia, unspecified: Secondary | ICD-10-CM

## 2021-01-02 DIAGNOSIS — F439 Reaction to severe stress, unspecified: Secondary | ICD-10-CM

## 2021-01-02 LAB — CBC
HCT: 46.3 % (ref 39.0–52.0)
Hemoglobin: 16 g/dL (ref 13.0–17.0)
MCHC: 34.4 g/dL (ref 30.0–36.0)
MCV: 86.2 fl (ref 78.0–100.0)
Platelets: 267 10*3/uL (ref 150.0–400.0)
RBC: 5.38 Mil/uL (ref 4.22–5.81)
RDW: 13.2 % (ref 11.5–15.5)
WBC: 3.2 10*3/uL — ABNORMAL LOW (ref 4.0–10.5)

## 2021-01-02 LAB — COMPREHENSIVE METABOLIC PANEL
ALT: 19 U/L (ref 0–53)
AST: 19 U/L (ref 0–37)
Albumin: 4.4 g/dL (ref 3.5–5.2)
Alkaline Phosphatase: 69 U/L (ref 39–117)
BUN: 10 mg/dL (ref 6–23)
CO2: 28 mEq/L (ref 19–32)
Calcium: 9.7 mg/dL (ref 8.4–10.5)
Chloride: 100 mEq/L (ref 96–112)
Creatinine, Ser: 1 mg/dL (ref 0.40–1.50)
GFR: 81.91 mL/min (ref >60.00)
Glucose, Bld: 93 mg/dL (ref 70–99)
Potassium: 4 mEq/L (ref 3.5–5.1)
Sodium: 136 mEq/L (ref 135–145)
Total Bilirubin: 0.5 mg/dL (ref 0.2–1.2)
Total Protein: 7.6 g/dL (ref 6.0–8.3)

## 2021-01-02 LAB — LIPASE: Lipase: 24 U/L (ref 11.0–59.0)

## 2021-01-02 MED ORDER — DOXEPIN HCL 6 MG PO TABS: ORAL_TABLET | ORAL | 3 refills | Status: DC

## 2021-01-02 MED ORDER — CLORAZEPATE DIPOTASSIUM 3.75 MG PO TABS: 3.7500 mg | ORAL_TABLET | Freq: Two times a day (BID) | ORAL | 3 refills | Status: DC | PRN

## 2021-01-02 MED ORDER — LEVOTHYROXINE SODIUM 50 MCG PO TABS: 50.0000 ug | ORAL_TABLET | Freq: Every day | ORAL | 1 refills | Status: DC

## 2021-01-02 NOTE — Assessment & Plan Note (Signed)
Worsened.  He has been off doxepin.  Will restart.  If not able to be performed we will would consider trial of trazodone or potentially liquid formulation of doxepin.  States that doses higher than 6 mg are too strong.

## 2021-01-02 NOTE — Patient Instructions (Signed)
It was very nice to see you today!  We will check blood work and an ultrasound.  Please restart your doxepin and Synthroid.  Please send me a message in a couple weeks to let me know how you are doing.  Take care, Dr [name]  Please try these tips to maintain a healthy lifestyle:   Eat at least 3 REAL meals and 1-2 snacks per day.  Aim for no more than 5 hours between eating.  If you eat breakfast, please do so within one hour of getting up.    Each meal should contain half fruits/vegetables, one quarter protein, and one quarter carbs (no bigger than a computer mouse)   Cut down on sweet beverages. This includes juice, soda, and sweet tea.     Drink at least 1 glass of water with each meal and aim for at least 8 glasses per day   Exercise at least 150 minutes every week.

## 2021-01-02 NOTE — Assessment & Plan Note (Signed)
Worsened.  Will refill Tranxene today.  Also restart doxepin as above.

## 2021-01-02 NOTE — Progress Notes (Signed)
   Glenn Kirby is a 60 y.o. male who presents today for an office visit.  Assessment/Plan:  New/Acute Problems: RUQ Abdominal Pain Concern for possible biliary etiology given worsening after food.  PUD is also a consideration though has no history of reflux or heartburn.  Will check labs today and right upper quadrant ultrasound.  Depending on results may consider trial of PPI or may need referral to surgery.  Discussed reasons to return to care.  Chronic issues discussed today: Insomnia Worsened.  He has been off doxepin.  Will restart.  If not able to be performed we will would consider trial of trazodone or potentially liquid formulation of doxepin.  States that doses higher than 6 mg are too strong.  Stress Worsened.  Will refill Tranxene today.  Also restart doxepin as above.      Subjective:  HPI:  Patient with RUQ pain for the last 10 days ago. Comes and goes. No nausea or vomiting. No medications tried. No ocnsitpation or diarrhea. No fevers or chills. No reflux or heart burn. Sometimes worse after food.        Objective:  Physical Exam: BP 132/81   Pulse 68   Temp 97.7 F (36.5 C) (Temporal)   Ht 5\' 8"  (1.727 m)   Wt 197 lb (89.4 kg)   SpO2 99%   BMI 29.95 kg/m   Gen: No acute distress, resting comfortably CV: Regular rate and rhythm with no murmurs appreciated Pulm: Normal work of breathing, clear to auscultation bilaterally with no crackles, wheezes, or rhonchi GU: RUQ TTP. No rebound or guarding. Bowel sounds present.  Neuro: Grossly normal, moves all extremities Psych: Normal affect and thought content      Glenn Kirby M. , MD 01/02/2021 1:42 PM

## 2021-01-03 NOTE — Progress Notes (Signed)
Please inform patient of the following:  Blood work is all stable. We will contact him with results of his ultrasound once it is back.  Katina Degree. [name], MD 01/03/2021 9:18 AM

## 2021-01-05 ENCOUNTER — Ambulatory Visit (HOSPITAL_COMMUNITY): Payer: BC Managed Care – PPO

## 2021-01-09 ENCOUNTER — Ambulatory Visit: Payer: BC Managed Care – PPO | Admitting: Family Medicine

## 2021-03-28 ENCOUNTER — Other Ambulatory Visit: Payer: Self-pay | Admitting: Family Medicine

## 2021-06-26 ENCOUNTER — Other Ambulatory Visit: Payer: Self-pay | Admitting: Family Medicine

## 2021-08-25 ENCOUNTER — Other Ambulatory Visit: Payer: Self-pay | Admitting: Family Medicine

## 2021-09-16 ENCOUNTER — Other Ambulatory Visit: Payer: Self-pay | Admitting: Family Medicine

## 2021-09-18 ENCOUNTER — Encounter: Payer: Self-pay | Admitting: Family Medicine

## 2021-09-19 ENCOUNTER — Encounter: Payer: Self-pay | Admitting: Physician Assistant

## 2021-09-19 ENCOUNTER — Other Ambulatory Visit: Payer: Self-pay

## 2021-09-19 ENCOUNTER — Ambulatory Visit: Payer: BC Managed Care – PPO | Admitting: Physician Assistant

## 2021-09-19 VITALS — BP 112/75 | HR 76 | Temp 97.4°F | Ht 68.0 in | Wt 198.0 lb

## 2021-09-19 DIAGNOSIS — M542 Cervicalgia: Secondary | ICD-10-CM | POA: Diagnosis not present

## 2021-09-19 DIAGNOSIS — H8110 Benign paroxysmal vertigo, unspecified ear: Secondary | ICD-10-CM

## 2021-09-19 DIAGNOSIS — F439 Reaction to severe stress, unspecified: Secondary | ICD-10-CM | POA: Diagnosis not present

## 2021-09-19 MED ORDER — CLORAZEPATE DIPOTASSIUM 3.75 MG PO TABS: 3.7500 mg | ORAL_TABLET | Freq: Two times a day (BID) | ORAL | 0 refills | Status: DC | PRN

## 2021-09-19 MED ORDER — MELOXICAM 15 MG PO TABS: ORAL_TABLET | ORAL | 0 refills | Status: DC

## 2021-09-19 MED ORDER — MECLIZINE HCL 25 MG PO TABS: 25.0000 mg | ORAL_TABLET | Freq: Three times a day (TID) | ORAL | 0 refills | Status: DC | PRN

## 2021-09-19 NOTE — Progress Notes (Signed)
Subjective:    Patient ID: Glenn Kirby, male    DOB: 09-Oct-1961, 60 y.o.   MRN: [id number]  Chief Complaint  Patient presents with   Back Pain    HPI   Neck Pain  Mr. Petrucelli presents with c/o constant posterior neck pain that has been onset for a week. States this pain is not new but has recently become a constant issue. In the past he was able to treat the pain with OTC medications and it would subside, but now that is not the case.  Initially he tried to receive pain medication prescribed to him by Dr. [name] but has not been seen since February 2022 for this and refill was declined. Since then he has been using OTC medications to manage his pain which provided minor relief.  Mr. Kissick expressed he believes it could be arthritis in his neck. Denies floaters, double vision, CP, hearing trouble, ringing in ears, or SOB. Admitted he has occasional numbness in left hand and left leg but this never occurs at the same time as his neck pain.   Vertigo  Along with neck pain, Zandon has also been experiencing dizziness while doing simple tasks. During our visit, he stated he has vertigo episodes more so on his left side. Although he has increased anxiety with these episodes, he does not feel unsafe or unmanageable.   Overall, his symptoms worsen his anxiety, and he feels like he has a brain tumor. No history of this in his family and his brain MRI in 01/2020 was negative for this as well.  No past medical history on file.  Past Surgical History:  Procedure Laterality Date   LEFT HEART CATHETERIZATION WITH CORONARY ANGIOGRAM N/A 05/12/2013   Procedure: LEFT HEART CATHETERIZATION WITH CORONARY ANGIOGRAM;  Surgeon: Robynn Pane, MD;  Location: [facility] CATH LAB;  Service: Cardiovascular;  Laterality: N/A;    Family History  Problem Relation Age of Onset   Cancer Neg Hx     Social History   Tobacco Use   Smoking status: Never   Smokeless tobacco: Never  Vaping Use   Vaping Use:  Never used  Substance Use Topics   Alcohol use: No   Drug use: Never     No Known Allergies  Review of Systems REFER TO HPI FOR PERTINENT POSITIVES AND NEGATIVES      Objective:     BP 112/75   Pulse 76   Temp (!) 97.4 F (36.3 C)   Ht 5\' 8"  (1.727 m)   Wt 198 lb (89.8 kg)   SpO2 97%   BMI 30.11 kg/m   Wt Readings from Last 3 Encounters:  09/19/21 198 lb (89.8 kg)  01/02/21 197 lb (89.4 kg)  08/26/20 198 lb 3.2 oz (89.9 kg)    BP Readings from Last 3 Encounters:  09/19/21 112/75  01/02/21 132/81  08/26/20 (!) 142/87     Physical Exam Vitals and nursing note reviewed.  Constitutional:      General: He is not in acute distress.    Appearance: He is well-developed. He is not ill-appearing or toxic-appearing.  HENT:     Right Ear: Tympanic membrane, ear canal and external ear normal.     Left Ear: Tympanic membrane, ear canal and external ear normal.  Eyes:     Extraocular Movements: Extraocular movements intact.     Conjunctiva/sclera: Conjunctivae normal.     Pupils: Pupils are equal, round, and reactive to light.  Cardiovascular:     Rate  and Rhythm: Normal rate and regular rhythm.     Pulses: Normal pulses.     Heart sounds: Normal heart sounds, S1 normal and S2 normal.  Pulmonary:     Effort: Pulmonary effort is normal.     Breath sounds: Normal breath sounds.  Abdominal:     General: Abdomen is flat.     Palpations: Abdomen is soft.     Tenderness: There is no right CVA tenderness or left CVA tenderness.  Musculoskeletal:        General: Normal range of motion.     Right lower leg: No edema.     Left lower leg: No edema.  Skin:    General: Skin is warm and dry.  Neurological:     General: No focal deficit present.     Mental Status: He is alert and oriented to person, place, and time.     GCS: GCS eye subscore is 4. GCS verbal subscore is 5. GCS motor subscore is 6.     Cranial Nerves: Cranial nerves 2-12 are intact. No cranial nerve deficit.      Sensory: No sensory deficit.     Motor: No weakness.     Gait: Gait normal.     Deep Tendon Reflexes: Reflexes normal.     Comments: Strength 5 out of 5 in all extremities  Patient declined Dix-Hallpike test out of fear of dizziness - stating he knows it will reproduce his symptoms.  Psychiatric:        Speech: Speech normal.        Behavior: Behavior normal. Behavior is cooperative.       Assessment & Plan:   Problem List Items Addressed This Visit       Other   Stress   Other Visit Diagnoses     Neck pain    -  Primary   Benign paroxysmal positional vertigo, unspecified laterality            Meds ordered this encounter  Medications   meloxicam (MOBIC) 15 MG tablet    Sig: TAKE 1 TABLET(15 MG) BY MOUTH DAILY    Dispense:  30 tablet    Refill:  0   DISCONTD: meclizine (ANTIVERT) 25 MG tablet    Sig: Take 1 tablet (25 mg total) by mouth 3 (three) times daily as needed for dizziness.    Dispense:  15 tablet    Refill:  0   clorazepate (TRANXENE) 3.75 MG tablet    Sig: Take 1 tablet (3.75 mg total) by mouth 2 (two) times daily as needed for anxiety.    Dispense:  10 tablet    Refill:  0   meclizine (ANTIVERT) 25 MG tablet    Sig: Take 1 tablet (25 mg total) by mouth 3 (three) times daily as needed for dizziness.    Dispense:  15 tablet    Refill:  0   1. Neck pain 2. Benign paroxysmal positional vertigo, unspecified laterality 3. Stress Patient presents today with a similar presentation as his last visit with Dr. [name].  He was treated with meloxicam, Antivert, and clorazepate, which he requests refills of today, stating that this resolved his symptoms last time.  He does not have any focal neurologic findings.  He may have arthritis in his neck, which is contributing to his pain.  It seems that he also has benign positional vertigo, which increases his anxiety and stress.  He is scheduled to fly out of the state today for a conference  and will return in 3 days.   I refilled the medications as requested.  I strongly encouraged him to hydrate aggressively and continue to work on eating healthy diet options.  I also provided some relaxation and breathing techniques in his AVS.  He is going to follow-up with PCP Dr. [name] on Monday for long-term plan and follow-up of these issues.  He knows to go to the emergency department in the event of any sudden severe changes such as chest pain, shortness of breath, weakness on one side, worst headache of life, slurred speech, etc.   This note was prepared with assistance of Dragon voice recognition software. Occasional wrong-word or sound-a-like substitutions may have occurred due to the inherent limitations of voice recognition software.  Time Spent: 32 minutes of total time was spent on the date of the encounter performing the following actions: chart review prior to seeing the patient, obtaining history, performing a medically necessary exam, counseling on the treatment plan, placing orders, and documenting in our EHR.     I,Havlyn C Ratchford,acting as a scribe for [employer], PA-C.,have documented all relevant documentation on the behalf of Nyeemah Jennette M Jason Frisbee, PA-C,as directed by  [employer], PA-C while in the presence of Stiles Maxcy M Fatimah Sundquist, PA-C.  I, Shamone Winzer M Aaliya Maultsby, PA-C, have reviewed all documentation for this visit. The documentation on 09/19/21 for the exam, diagnosis, procedures, and orders are all accurate and complete.

## 2021-09-19 NOTE — Patient Instructions (Addendum)
*  Please confirm his f/up appt with Dr. [name] on Monday   Low threshold to go to the ED if any acute sudden change in symptoms.  Take the medications as directed. Breathing techniques. Heating pad as needed. Tylenol as needed. See handout for additional help about stress.

## 2021-09-20 NOTE — Telephone Encounter (Signed)
Patient is scheduled on 09/25/21.

## 2021-09-20 NOTE — Telephone Encounter (Signed)
Please schedule appt! Thanks

## 2021-09-25 ENCOUNTER — Ambulatory Visit: Payer: BC Managed Care – PPO | Admitting: Family Medicine

## 2021-09-25 ENCOUNTER — Other Ambulatory Visit: Payer: Self-pay

## 2021-09-25 ENCOUNTER — Encounter: Payer: Self-pay | Admitting: Family Medicine

## 2021-09-25 VITALS — BP 125/81 | HR 68 | Temp 98.2°F | Ht 68.0 in | Wt 198.2 lb

## 2021-09-25 DIAGNOSIS — G4700 Insomnia, unspecified: Secondary | ICD-10-CM

## 2021-09-25 DIAGNOSIS — Z1211 Encounter for screening for malignant neoplasm of colon: Secondary | ICD-10-CM

## 2021-09-25 DIAGNOSIS — M542 Cervicalgia: Secondary | ICD-10-CM | POA: Diagnosis not present

## 2021-09-25 DIAGNOSIS — R519 Headache, unspecified: Secondary | ICD-10-CM | POA: Diagnosis not present

## 2021-09-25 NOTE — Progress Notes (Signed)
   Glenn Kirby is a 60 y.o. male who presents today for an office visit.  Assessment/Plan:  New/Acute Problems: Headache / Neck Pain No red flags.  Likely degenerative changes and possibly muscular strain.  Given that he has had recurrent issues for this for the past year or so would be reasonable to check imaging at this time.  We will order CT head and neck.  We will continue current regimen of meloxicam daily.  Depending on results may need referral to physical therapy.  Discussed reasons to return to care.  Chronic Problems Addressed Today: Insomnia Not controlled.  Oxiconazole longer working well.  He has been taking clorazepate for the last week or so which works well.  May consider trial of trazodone in the future if this continues to be an issue.     Subjective:  HPI:  He complains of worsening pain in the bak of his neck.  This has been a recurrent issue.  Flareup couple of weeks ago.  Saw another provider last week and started on meloxicam and clorazepate.  Additionally the condition has been putting mental stress and anxiety on him, which he has recently been prescribed Clorazepate for. In relation to these issues, his difficulty sleeping has increased.    The pain has been persisting for ~ 2 weeks. He admits to having this issue in the past. He admits to pain when turning his neck left or right. No weakness of numbness.   Recently, the sleeping medication he uses causes him to be very drowsy when he wakes up while not being as effective as before.           Objective:  Physical Exam: BP 125/81   Pulse 68   Temp 98.2 F (36.8 C) (Temporal)   Ht 5\' 8"  (1.727 m)   Wt 198 lb 3.2 oz (89.9 kg)   SpO2 97%   BMI 30.14 kg/m   Gen: No acute distress, resting comfortably CV: Regular rate and rhythm with no murmurs appreciated Pulm: Normal work of breathing, clear to auscultation bilaterally with no crackles, wheezes, or rhonchi MSK: Neck without deformities.  Nontender to  palpation.  Upper extremities well forage motion throughout.  Neurovascularly intact distally in upper and lower extremities. Neuro: Grossly normal, moves all extremities Psych: Normal affect and thought content      I,Jordan Kelly,acting as a scribe for , MD.,have documented all relevant documentation on the behalf of Jacquiline Doe, MD,as directed by  Jacquiline Doe, MD while in the presence of Jacquiline Doe, MD.  I, Jacquiline Doe, MD, have reviewed all documentation for this visit. The documentation on 09/25/21 for the exam, diagnosis, procedures, and orders are all accurate and complete.  13/07/22. Katina Degree, MD 09/25/2021 10:36 AM

## 2021-09-25 NOTE — Assessment & Plan Note (Signed)
Not controlled.  Oxiconazole longer working well.  He has been taking clorazepate for the last week or so which works well.  May consider trial of trazodone in the future if this continues to be an issue.

## 2021-09-25 NOTE — Patient Instructions (Signed)
It was very nice to see you today!  We will check CT scan.  I think you probably have arthritis.  We may need to send you to see a physical therapist depending on the results.  Please send a message in a few weeks if you need a refill on your meds.  Take care, Dr [name]  PLEASE NOTE:  If you had any lab tests please let us know if you have not heard back within a few days. You may see your results on mychart before we have a chance to review them but we will give you a call once they are reviewed by [place]. If we ordered any referrals today, please let us know if you have not heard from their office within the next week.   Please try these tips to maintain a healthy lifestyle:  Eat at least 3 REAL meals and 1-2 snacks per day.  Aim for no more than 5 hours between eating.  If you eat breakfast, please do so within one hour of getting up.   Each meal should contain half fruits/vegetables, one quarter protein, and one quarter carbs (no bigger than a computer mouse)  Cut down on sweet beverages. This includes juice, soda, and sweet tea.   Drink at least 1 glass of water with each meal and aim for at least 8 glasses per day  Exercise at least 150 minutes every week.

## 2021-10-24 ENCOUNTER — Other Ambulatory Visit: Payer: Self-pay | Admitting: Physician Assistant

## 2021-10-26 LAB — COLOGUARD: COLOGUARD: NEGATIVE

## 2021-10-26 NOTE — Progress Notes (Signed)
Please inform patient of the following:  Good news! Cologuard is negative. We can recheck in 3 months.  Glenn Kirby. [name], MD 10/26/2021 11:28 AM

## 2021-10-26 NOTE — Progress Notes (Signed)
Correction: we should recheck in 3 years.  Glenn Kirby. [name], MD 10/26/2021 11:28 AM

## 2021-11-07 ENCOUNTER — Other Ambulatory Visit: Payer: Self-pay | Admitting: *Deleted

## 2021-11-07 DIAGNOSIS — M542 Cervicalgia: Secondary | ICD-10-CM

## 2021-11-08 ENCOUNTER — Telehealth: Payer: Self-pay | Admitting: *Deleted

## 2021-11-08 NOTE — Telephone Encounter (Signed)
Patient has returned call.  Please follow back up at [phone].

## 2021-11-08 NOTE — Telephone Encounter (Signed)
Left message on voicemail to call office.  

## 2021-11-08 NOTE — Telephone Encounter (Signed)
Spoke to pt told him had to order x-ray of neck before can proceed with CT scan due to insurance. Pt verbalized understanding. Told pt need to go to [facility] located at [facility], x-ray is in the basement of the building you do not need an appt can just walk in. They are open 8:30 to 5:00 and closed for lunch 12:30 - 1:00 PM. Pt verbalized understanding and will go next week. Told him okay.

## 2021-12-01 ENCOUNTER — Ambulatory Visit (INDEPENDENT_AMBULATORY_CARE_PROVIDER_SITE_OTHER)
Admission: RE | Admit: 2021-12-01 | Discharge: 2021-12-01 | Disposition: A | Discharge status: Home / Self Care | Payer: BC Managed Care – PPO | Source: Ambulatory Visit | Attending: Family Medicine | Admitting: Family Medicine

## 2021-12-01 ENCOUNTER — Other Ambulatory Visit: Payer: Self-pay

## 2021-12-01 DIAGNOSIS — M542 Cervicalgia: Secondary | ICD-10-CM

## 2021-12-04 NOTE — Progress Notes (Signed)
Please inform patient of the following:  His xray shows degenerative changes and possible nerve impingement. This is nothing urgent but recommend we refer him to see sports medicine for further management.  Katina Degree. [name], MD 12/04/2021 8:22 AM

## 2021-12-06 ENCOUNTER — Other Ambulatory Visit: Payer: Self-pay | Admitting: *Deleted

## 2021-12-06 DIAGNOSIS — Z8669 Personal history of other diseases of the nervous system and sense organs: Secondary | ICD-10-CM

## 2021-12-07 NOTE — Progress Notes (Signed)
Subjective:    CC: Neck pain  I, Glenn Kirby, LAT, ATC, am serving as scribe for Dr. Clementeen Graham.  HPI: Pt is a 61 y/o male presenting w/ c/o chronic neck pain.  He has been seen by his PCP team multiple times for these c/o.  He locates his pain to his post neck and into his L UE .  Radiating pain: yes into his L UE to his L wrist  UE numbness/tingling: yes into his L UE to his wrist Aggravating factors: increased symptoms at night Treatments tried: Meloxicam; Clorazepate; topical pain-relieving rubs; OTC anti-inflammatories  Diagnostic testing: C-spine XR- 12/01/21; CT head and C-spine ordered  Pertinent review of Systems: No fevers or chills  Relevant historical information: Hypothyroidism.  Dyslipidemia.   Objective:    Vitals:   12/08/21 0807  BP: 110/80  Pulse: 71  SpO2: 96%   General: Well Developed, well nourished, and in no acute distress.   MSK: C-spine: Normal-appearing Nontender midline. Tender palpation cervical paraspinal musculature. Decreased cervical range of motion. Upper extremity strength is decreased to shoulder abduction but otherwise intact. Reflexes are intact.   Lab and Radiology Results  EXAM: CERVICAL SPINE - COMPLETE 4+ VIEW   COMPARISON:  None.   FINDINGS: On the lateral view the cervical spine is visualized to the level of C7. Straightening of the normal cervical lordosis likely due to positioning and degenerative changes. Alignment is otherwise normal.   Dens is well positioned between the lateral masses of C1. There is limited evaluation of the dens for acute fracture on the open-mouth view due to overlying osseous structures.   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 No acute displaced fracture is detected.No aggressive-appearing focal osseous lesions.   Pre-vertebral soft tissues are within normal limits.   IMPRESSION: 1.  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 Limited evaluation due to overlapping osseous structures and overlying soft tissues. 2. No acute displaced fracture or traumatic listhesis of the cervical spine.     Electronically Signed   By: Tish Frederickson M.D.   On: 12/01/2021 20:43 I, Clementeen Graham, personally (independently) visualized and performed the interpretation of the images attached in this note.     Impression and Recommendations:    Assessment and Plan: 61 y.o. male with chronic neck pain with some left arm radiculopathy at perhaps anywhere between C5 to C8 dermatomal pattern. He is not able to be more precise about the location of his symptoms down his left arm as they do not include his hand. His dominant issue is more neck pain than arm symptoms.  We discussed his treatment plan and options and his physical exam and x-ray findings.  Plan for trial of physical therapy over the next 6 weeks.  If not improved neck step probably would be MRI to further evaluate his cervical spine and for potential injection planning either facet joints or epidural steroid injection. Recheck in about 6 weeks. Of note CT scan head and C-spine has been ordered.  From my perspective I think we can hold off on the C-spine CT scan as an MRI would be more helpful.  PDMP not reviewed this encounter. Orders Placed This Encounter  Procedures   Ambulatory referral to Physical Therapy    Referral Priority:   Routine    Referral Type:   Physical Medicine    Referral Reason:  Specialty Services Required    Requested Specialty:   Physical Therapy    Number of Visits Requested:   1   No orders of the defined types were placed in this encounter.   Discussed warning signs or symptoms. Please see discharge instructions. Patient expresses understanding.   The above documentation has been reviewed and is accurate and  complete Clementeen Graham, M.D.

## 2021-12-08 ENCOUNTER — Other Ambulatory Visit: Payer: Self-pay

## 2021-12-08 ENCOUNTER — Encounter: Payer: Self-pay | Admitting: Family Medicine

## 2021-12-08 ENCOUNTER — Ambulatory Visit: Payer: BC Managed Care – PPO | Admitting: Family Medicine

## 2021-12-08 VITALS — BP 110/80 | HR 71 | Ht 68.0 in | Wt 197.0 lb

## 2021-12-08 DIAGNOSIS — M5412 Radiculopathy, cervical region: Secondary | ICD-10-CM

## 2021-12-08 DIAGNOSIS — M542 Cervicalgia: Secondary | ICD-10-CM | POA: Diagnosis not present

## 2021-12-08 NOTE — Patient Instructions (Addendum)
Nice to meet you.  I've referred you to Physical Therapy at [facility].  Their office will call you to schedule but please let us know if you haven't heard from them by the end of next week regarding scheduling.  Follow-up: 6 weeks

## 2021-12-27 ENCOUNTER — Encounter: Payer: Self-pay | Admitting: Family Medicine

## 2021-12-28 ENCOUNTER — Ambulatory Visit (INDEPENDENT_AMBULATORY_CARE_PROVIDER_SITE_OTHER): Payer: BC Managed Care – PPO | Admitting: Family Medicine

## 2021-12-28 ENCOUNTER — Encounter: Payer: Self-pay | Admitting: Family Medicine

## 2021-12-28 ENCOUNTER — Other Ambulatory Visit: Payer: Self-pay

## 2021-12-28 ENCOUNTER — Ambulatory Visit: Payer: BC Managed Care – PPO | Admitting: Physical Therapy

## 2021-12-28 VITALS — BP 112/74 | HR 81 | Temp 97.9°F | Ht 68.0 in | Wt 198.0 lb

## 2021-12-28 DIAGNOSIS — M542 Cervicalgia: Secondary | ICD-10-CM

## 2021-12-28 DIAGNOSIS — G4700 Insomnia, unspecified: Secondary | ICD-10-CM

## 2021-12-28 DIAGNOSIS — M545 Low back pain, unspecified: Secondary | ICD-10-CM

## 2021-12-28 MED ORDER — CYCLOBENZAPRINE HCL 10 MG PO TABS: 10.0000 mg | ORAL_TABLET | Freq: Three times a day (TID) | ORAL | 0 refills | Status: DC | PRN

## 2021-12-28 MED ORDER — MELOXICAM 15 MG PO TABS: 15.0000 mg | ORAL_TABLET | Freq: Every day | ORAL | 0 refills | Status: DC

## 2021-12-28 NOTE — Assessment & Plan Note (Signed)
Doing well with meloxicam as needed.  Will refill today.

## 2021-12-28 NOTE — Progress Notes (Signed)
° °  Glenn Kirby is a 61 y.o. male who presents today for an office visit.  Assessment/Plan:  New/Acute Problems: Back Pain No red flags.  Likely muscular strain.  We will start Flexeril and refill meloxicam.  Also can use heating pad.  We discussed reasons to return to care.  Follow-up as needed.  Chronic Problems Addressed Today: Neck pain Doing well with meloxicam as needed.  Will refill today.  Insomnia Stable on doxepin nightly.      Subjective:  HPI:  Patient here with back pain. This started  about 4 days ago. Located on lower back. He notes he might have strained a muscle when [place] the airport. Has back pain since then. Worse with certain motions. He has tried ibuprofen, heating pads, tiger balm and Flexeril. This has helped. He notes he took Flexeril 10 mg to help alleviate symptoms. No side effects. No numbness or weakness. No tingling.       Objective:  Physical Exam: BP 112/74    Pulse 81    Temp 97.9 F (36.6 C) (Temporal)    Ht 5\' 8"  (1.727 m)    Wt 198 lb (89.8 kg)    SpO2 96%    BMI 30.11 kg/m   Gen: No acute distress, resting comfortably CV: Regular rate and rhythm with no murmurs appreciated Pulm: Normal work of breathing, clear to auscultation bilaterally with no crackles, wheezes, or rhonchi MSK: Low back without deformities.  Tenderness to palpation along bilateral lower lumbar paraspinal muscles.  Neurovascular intact distally. Neuro: Grossly normal, moves all extremities Psych: Normal affect and thought content       I,Savera Zaman,acting as a scribe for , MD.,have documented all relevant documentation on the behalf of Jacquiline Doe, MD,as directed by  Jacquiline Doe, MD while in the presence of Jacquiline Doe, MD.   I, Jacquiline Doe, MD, have reviewed all documentation for this visit. The documentation on 12/28/21 for the exam, diagnosis, procedures, and orders are all accurate and complete.  02/25/22. Katina Degree, MD 12/28/2021 2:33  PM

## 2021-12-28 NOTE — Assessment & Plan Note (Signed)
Stable on doxepin nightly.

## 2021-12-28 NOTE — Patient Instructions (Signed)
It was very nice to see you today!  Please take the meloxicam and Flexeril.  You can use a heating pad as needed as well.  Let me know if not improving in the next 1 to 2 weeks.  Take care, Dr [name]  PLEASE NOTE:  If you had any lab tests please let us know if you have not heard back within a few days. You may see your results on mychart before we have a chance to review them but we will give you a call once they are reviewed by [place]. If we ordered any referrals today, please let us know if you have not heard from their office within the next week.   Please try these tips to maintain a healthy lifestyle:  Eat at least 3 REAL meals and 1-2 snacks per day.  Aim for no more than 5 hours between eating.  If you eat breakfast, please do so within one hour of getting up.   Each meal should contain half fruits/vegetables, one quarter protein, and one quarter carbs (no bigger than a computer mouse)  Cut down on sweet beverages. This includes juice, soda, and sweet tea.   Drink at least 1 glass of water with each meal and aim for at least 8 glasses per day  Exercise at least 150 minutes every week.

## 2021-12-28 NOTE — Therapy (Signed)
OUTPATIENT PHYSICAL THERAPY  EVALUATION   Patient Name: Glenn Kirby MRN: [id number] DOB:01-17-1961, 61 y.o., male Today's Date: 12/28/2021   PT End of Session - 12/29/21 0952     Visit Number 1    Number of Visits 12    Date for PT Re-Evaluation 02/08/22    Authorization Type BCBS    PT Start Time 0802    PT Stop Time 0843    PT Time Calculation (min) 41 min    Activity Tolerance Patient tolerated treatment well    Behavior During Therapy [facility] for tasks assessed/performed             No past medical history on file. 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  Waking him up with pain. Most pain at night.  R handed. Working full time. Has had neck pain in the past. No pain in R side.  Has had back pain in the past. Was at airport last week, lifted bag, pain in Center/ low. No radicular pain.    PERTINENT HISTORY:  none  PAIN:  Are you having pain? Yes NPRS scale: 8/10 Pain location: Neck Pain orientation: Left  PAIN TYPE: aching Pain description:  intermittent  Aggravating factors: Sleeping, L rotation Relieving factors: increased activity   Are you having pain? Yes NPRS scale: 10/10 Pain location: Low back  Pain orientation: Bilateral  PAIN TYPE: aching Pain description: intermittent  Aggravating factors: any/all activity/movment  Relieving factors: laying down  PRECAUTIONS: None  WEIGHT BEARING RESTRICTIONS No  FALLS:  Has patient fallen in last 6 months? No, Number of falls: 0   PLOF: Independent  PATIENT GOALS  Decreased pain    OBJECTIVE:  DIAGNOSTIC FINDINGS:  DG 12/01/21 1. 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 Limited evaluation due to overlapping osseous structures and overlying soft tissues.   COGNITION:  Overall cognitive status: Within functional limits for tasks assessed   POSTURE:  Forward head   PALPATION:  Tenderness in mid/lower c-spine, in central and L vertebrae/spinous process. Tenderness with PA s  in lower c-spine and C/T junction    AROM/PROM  12/28/21:  Cervical ROM: WFL  Pain with extension and L rotation UE: WNL Lumbar: mod limitation for flex and ext/ with pain    MMT:  12/28/21: UE: 5/5    SPECIAL TESTS:  Pt declines back testing or treatment today due to pain   TODAY'S TREATMENT  Ther ex: see below for HEP Manual: manual cervical traction x 4 min, DTM to bil cervical paraspinals, Cervical and c-t junction PA mobs, cervical side glides Self Care: discussed posture/optimal posture, head posture and optimal pillow positioning for sleeping and neck pain    PATIENT EDUCATION:  Education details: PT POC, Exam findings, HEP,  Person educated: Patient Education method: Explanation, Demonstration, Tactile cues, Verbal cues, and Handouts Education comprehension: verbalized understanding, returned demonstration, verbal cues required, tactile cues required, and needs further  education   HOME EXERCISE PROGRAM: Access Code: [email or web address] URL: [email or web address] Date: 12/28/2021 Prepared by: Sedalia Muta  Exercises Seated Correct Posture Seated Scapular Retraction - 2 x daily - 1 sets - 10 reps Standing Backward Shoulder Rolls - 2 x daily - 1 sets - 10 reps   ASSESSMENT:  CLINICAL IMPRESSION:  12/28/21: Eval Pt presents with primary complaint of increased pain in neck. He has increased pain in L cervical musculature, and in c/t juntion region. He has stiffness in C-spine, but only minimal ROM limitations. Symptoms consistent with recent x-ray findings of degeneration and stenosis on L. Pt with poor posture and postural awareness. He will benefit from education on shoulder and head posture as well as strengthening. Discussed head and pillow positioning for sleep and neck pain today. Pt also with significant back pain, that started a few days ago. Pt with limited ROM and increased pain with most all motions. He declines further assessment or treatment for his back today, due to pain. May benefit from PT for back in future. Pt to benefit from skilled PT to improve deficits and pain.     Objective impairments include decreased activity tolerance, decreased knowledge of use of DME, decreased mobility, decreased ROM, decreased strength, hypomobility, increased muscle spasms, impaired flexibility, improper body mechanics, and pain. These impairments are limiting patient from cleaning, community activity, meal prep, and yard work. Personal factors including Fitness and Time since onset of injury/illness/exacerbation are also affecting patient's functional outcome. Patient will benefit from skilled PT to address above impairments and improve overall function.  REHAB POTENTIAL: Good  CLINICAL DECISION MAKING: Stable/uncomplicated  EVALUATION COMPLEXITY: Low   GOALS: Goals reviewed with patient? Yes  SHORT TERM GOALS:  STG Name Target Date Goal status   1 Patient will be independent with initial HEP   01/11/22 INITIAL  2 Pt to report improved pain at night with sleep, sleeping at least 6 hours without waking from pain.  01/11/22 INITIAL         LONG TERM GOALS:   LTG Name Target Date Goal status  1 Patient will be independent with final HEP  02/08/22 INITIAL  2 Pt to report decreased pain in Neck to 0-2/10  with sleeping and daily activities   02/08/22 INITIAL  3 Pt to demo ability to self correct posture in clinic, at least 75 % of the time.   02/08/22 INITIAL  4 Pt to demonstrate ability for full cervical ROM, to be WNL, without pain, to improve ability for driving and IADLs.   7/40/81 INITIAL  5          PLAN: PT FREQUENCY: 1-2x/week  PT DURATION: 6 weeks  PLANNED INTERVENTIONS: Therapeutic exercises, Therapeutic activity, Neuro Muscular re-education, Gait training, Patient/Family education, Joint mobilization, Stair training, DME instructions, Dry Needling, Electrical stimulation, Cryotherapy, Moist heat, Taping, Traction, Ultrasound, Ionotophoresis 4mg /ml Dexamethasone, and Manual therapy  PLAN FOR NEXT SESSION: Manual for neck, posture education and strengthening    , PT, DPT 10:12 AM  12/29/21

## 2022-01-03 ENCOUNTER — Ambulatory Visit: Payer: BC Managed Care – PPO | Admitting: Physical Therapy

## 2022-01-03 ENCOUNTER — Encounter: Payer: Self-pay | Admitting: Physical Therapy

## 2022-01-03 ENCOUNTER — Other Ambulatory Visit: Payer: Self-pay

## 2022-01-03 DIAGNOSIS — M542 Cervicalgia: Secondary | ICD-10-CM

## 2022-01-03 NOTE — Therapy (Signed)
OUTPATIENT PHYSICAL THERAPY TREATMENT    Patient Name: Glenn Kirby MRN: [id number] DOB:10-02-1961, 61 y.o., male Today's Date: 12/28/2021   PT End of Session - 01/03/22 1013     Visit Number 2    Number of Visits 12    Date for PT Re-Evaluation 02/08/22    Authorization Type BCBS    PT Start Time [phone]    PT Stop Time 1012    PT Time Calculation (min) 41 min    Activity Tolerance Patient tolerated treatment well    Behavior During Therapy [facility] for tasks assessed/performed              History reviewed. No pertinent past medical history. Past Surgical History:  Procedure Laterality Date   LEFT HEART CATHETERIZATION WITH CORONARY ANGIOGRAM N/A 05/12/2013   Procedure: LEFT HEART CATHETERIZATION WITH CORONARY ANGIOGRAM;  Surgeon: Robynn Pane, MD;  Location: [facility] CATH LAB;  Service: Cardiovascular;  Laterality: N/A;   Patient Active Problem List   Diagnosis Date Noted   Neck pain 12/28/2021   Subclinical hypothyroidism 06/22/2019   Leukopenia 06/22/2019   Dyslipidemia 05/26/2019   Hyperglycemia 05/26/2019   Insomnia 02/17/2019   Stress 10/31/2018    PCP: Ardith Dark, MD  REFERRING PROVIDER: Ardith Dark, MD  REFERRING DIAG: Neck Pain  THERAPY DIAG:  Cervicalgia  ONSET DATE:   SUBJECTIVE:                                                                                                                                                                                           SUBJECTIVE STATEMENT: Pt states improving pain in neck , and in back since last visit.     PERTINENT HISTORY:  none  PAIN:  Are you having pain? Yes NPRS scale: 6/10 Pain location: Neck Pain orientation: Left  PAIN TYPE: aching Pain description: intermittent  Aggravating factors: Sleeping, L rotation Relieving factors: increased activity   Are you having pain? Yes NPRS scale: 7/10 Pain location: Low back  Pain orientation: Bilateral  PAIN TYPE: aching Pain description:  intermittent  Aggravating factors: any/all activity/movment  Relieving factors: laying down  PRECAUTIONS: None  WEIGHT BEARING RESTRICTIONS No  FALLS:  Has patient fallen in last 6 months? No, Number of falls: 0   PLOF: Independent  PATIENT GOALS  Decreased pain    OBJECTIVE:   DIAGNOSTIC FINDINGS:  DG 12/01/21 1. 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 Limited evaluation due to overlapping osseous structures and overlying soft tissues.   COGNITION:  Overall cognitive status: Within functional limits for tasks assessed  POSTURE:  Forward head   PALPATION:  Tenderness in mid/lower c-spine, in central and L vertebrae/spinous process. Tenderness with PA s  in lower c-spine and C/T junction    AROM/PROM  12/28/21:  Cervical ROM: WFL  Pain with extension and L rotation UE: WNL Lumbar: mod limitation for flex and ext/ with pain    MMT:  12/28/21: UE: 5/5    SPECIAL TESTS:  Pt declines back testing or treatment today due to pain   TODAY'S TREATMENT   01/03/22 Therapeutic Exercise: Aerobic: Supine: Seated: Standing: Posture education at wall: chin tucks x 10 , wall angels x 15; Scap squeezes x 20; shoulder rolls x 15;  Stretches: Doorway 30 sec x 3 at 90 deg;  Supine pec stretch "T" x 2 min; UT 30 sec x 2 bil; cat cow x 15;   Neuromuscular Re-education: Manual Therapy: Manual Cervical traction x4 min; Cervical and thoracic PA mobs; C/T junt mobs;  Self Care: Trigger Point Dry Needling:     PATIENT EDUCATION:  Education details: updated and reviewed HEP, reviewed optimal posture   Person educated: Patient Education method: Explanation, Demonstration, Tactile cues, Verbal cues, and Handouts Education comprehension: verbalized understanding, returned demonstration, verbal cues required, tactile cues required, and needs further education   HOME EXERCISE PROGRAM: Access  Code: [email or web address]   ASSESSMENT:  CLINICAL IMPRESSION:  01/03/22: Education on optimal posture in standing and sitting/at desk. Pt with improving postural awareness-does require cuing for head position, tendency for fwd head. Most pain is in C/T juntion and lower c-spine. Pt with good tolerance for ther ex today, will benefit from continued education on posture.    Objective impairments include decreased activity tolerance, decreased knowledge of use of DME, decreased mobility, decreased ROM, decreased strength, hypomobility, increased muscle spasms, impaired flexibility, improper body mechanics, and pain. These impairments are limiting patient from cleaning, community activity, meal prep, and yard work. Personal factors including Fitness and Time since onset of injury/illness/exacerbation are also affecting patient's functional outcome. Patient will benefit from skilled PT to address above impairments and improve overall function.  REHAB POTENTIAL: Good  CLINICAL DECISION MAKING: Stable/uncomplicated  EVALUATION COMPLEXITY: Low   GOALS: Goals reviewed with patient? Yes  SHORT TERM GOALS:  STG Name Target Date Goal status  1 Patient will be independent with initial HEP   01/11/22 INITIAL  2 Pt to report improved pain at night with sleep, sleeping at least 6 hours without waking from pain.  01/11/22 INITIAL         LONG TERM GOALS:   LTG Name Target Date Goal status  1 Patient will be independent with final HEP  02/08/22 INITIAL  2 Pt to report decreased pain in Neck to 0-2/10 with sleeping and daily activities   02/08/22 INITIAL  3 Pt to demo ability to self correct posture in clinic, at least 75 % of the time.   02/08/22 INITIAL  4 Pt to demonstrate ability for full cervical ROM, to be WNL, without pain, to improve ability for driving and IADLs.   6/75/91 INITIAL  5          PLAN: PT FREQUENCY: 1-2x/week  PT DURATION: 6 weeks  PLANNED INTERVENTIONS: Therapeutic exercises,  Therapeutic activity, Neuro Muscular re-education, Gait training, Patient/Family education, Joint mobilization, Stair training, DME instructions, Dry Needling, Electrical stimulation, Cryotherapy, Moist heat, Taping, Traction, Ultrasound, Ionotophoresis 4mg /ml Dexamethasone, and Manual therapy  PLAN FOR NEXT SESSION: Manual for neck, posture education and strengthening    , PT, DPT  10:14 AM  01/03/22

## 2022-01-09 ENCOUNTER — Other Ambulatory Visit: Payer: Self-pay

## 2022-01-09 ENCOUNTER — Ambulatory Visit: Payer: BC Managed Care – PPO | Admitting: Physical Therapy

## 2022-01-09 ENCOUNTER — Encounter: Payer: Self-pay | Admitting: Physical Therapy

## 2022-01-09 ENCOUNTER — Encounter: Payer: BC Managed Care – PPO | Admitting: Physical Therapy

## 2022-01-09 DIAGNOSIS — M542 Cervicalgia: Secondary | ICD-10-CM

## 2022-01-09 DIAGNOSIS — M545 Low back pain, unspecified: Secondary | ICD-10-CM

## 2022-01-09 NOTE — Therapy (Signed)
OUTPATIENT PHYSICAL THERAPY TREATMENT    Patient Name: Glenn Kirby MRN: [id number] DOB:12-07-1960, 61 y.o., male Today's Date: 01/09/2022   PT End of Session - 01/09/22 1058     Visit Number 3    Number of Visits 12    Date for PT Re-Evaluation 02/08/22    Authorization Type BCBS    PT Start Time 1100    PT Stop Time 1140    PT Time Calculation (min) 40 min    Activity Tolerance Patient tolerated treatment well    Behavior During Therapy [facility] for tasks assessed/performed              History reviewed. No pertinent past medical history. Past Surgical History:  Procedure Laterality Date   LEFT HEART CATHETERIZATION WITH CORONARY ANGIOGRAM N/A 05/12/2013   Procedure: LEFT HEART CATHETERIZATION WITH CORONARY ANGIOGRAM;  Surgeon: Robynn Pane, MD;  Location: [facility] CATH LAB;  Service: Cardiovascular;  Laterality: N/A;   Patient Active Problem List   Diagnosis Date Noted   Neck pain 12/28/2021   Subclinical hypothyroidism 06/22/2019   Leukopenia 06/22/2019   Dyslipidemia 05/26/2019   Hyperglycemia 05/26/2019   Insomnia 02/17/2019   Stress 10/31/2018    PCP: Ardith Dark, MD  REFERRING PROVIDER: Ardith Dark, MD  REFERRING DIAG: Neck Pain  THERAPY DIAG:  Cervicalgia  Acute low back pain without sciatica, unspecified back pain laterality  ONSET DATE:   SUBJECTIVE:                                                                                                                                                                                           SUBJECTIVE STATEMENT: Pt states improving pain in neck, sleeping better too. Has moved computer closer and is doing better with posture at work.   PERTINENT HISTORY:  none  PAIN:  Are you having pain? Yes NPRS scale: 4/10 "much better"  Pain location: Neck Pain orientation: Left  PAIN TYPE: aching Pain description: intermittent  Aggravating factors: Sleeping, L rotation Relieving factors: increased  activity   Are you having pain? Yes NPRS scale: 4/10 Pain location: Low back , much better  Pain orientation: Bilateral  PAIN TYPE: aching Pain description: intermittent  Aggravating factors: any/all activity/movment  Relieving factors: laying down  PRECAUTIONS: None  WEIGHT BEARING RESTRICTIONS No  FALLS:  Has patient fallen in last 6 months? No, Number of falls: 0   PLOF: Independent  PATIENT GOALS  Decreased pain    OBJECTIVE:   DIAGNOSTIC FINDINGS:  DG 12/01/21 1. 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 Limited  evaluation due to overlapping osseous structures and overlying soft tissues.   COGNITION:  Overall cognitive status: Within functional limits for tasks assessed   POSTURE:  Forward head   PALPATION:  Tenderness in mid/lower c-spine, in central and L vertebrae/spinous process. Tenderness with PA s  in lower c-spine and C/T junction    AROM/PROM  12/28/21:  Cervical ROM: WFL  Pain with extension and L rotation UE: WNL Lumbar: mod limitation for flex and ext/ with pain    MMT:  12/28/21: UE: 5/5    SPECIAL TESTS:    TODAY'S TREATMENT   01/09/22: Therapeutic Exercise: Aerobic: Supine: Seated: Seated cat/cow x 20 with education for HEP  Standing:  chin tucks x 10 , wall angels x 20; Scap squeezes x 20; Rows Blue TB x 20; shoulder rolls x 15;  Stretches: Doorway 30 sec x 3 at 90 deg;  Supine pec stretch "T" x 2 min;  Neuromuscular Re-education: Manual Therapy: Manual Cervical traction x4 min; Cervical and thoracic PA mobs; C/T junt mobs;  Self Care: Discussed importance of posture, posture at work/computer, neck braces, Tens units, and use of home traction. Discussed possibility for home traction (but pts pain is more in high t-spine) so may not help very much, discussed neck brace and tens unit not being helpful at this time to improve  pain.    01/03/22 Therapeutic Exercise: Aerobic: Supine: Seated: Standing: Posture education at wall: chin tucks x 10 , wall angels x 15; Scap squeezes x 20; shoulder rolls x 15;  Stretches: Doorway 30 sec x 3 at 90 deg;  Supine pec stretch "T" x 2 min; UT 30 sec x 2 bil; cat cow x 15;   Neuromuscular Re-education: Manual Therapy: Manual Cervical traction x4 min; Cervical and thoracic PA mobs; C/T junt mobs;  Self Care: Trigger Point Dry Needling:     PATIENT EDUCATION:  Education details: updated and reviewed HEP, reviewed optimal posture   Person educated: Patient Education method: Explanation, Demonstration, Tactile cues, Verbal cues, and Handouts Education comprehension: verbalized understanding, returned demonstration, verbal cues required, tactile cues required, and needs further education   HOME EXERCISE PROGRAM: Access Code: [email or web address]   ASSESSMENT:  CLINICAL IMPRESSION:  01/09/22: Pt with improving pain in neck. Discussed importance of continued postural awareness. Pt with much difficulty with cat/cow in quadruped, did better today with spine mobility in seated position. Continued ther ex for postural strengthening. Plan to continue as tolerated.     Objective impairments include decreased activity tolerance, decreased knowledge of use of DME, decreased mobility, decreased ROM, decreased strength, hypomobility, increased muscle spasms, impaired flexibility, improper body mechanics, and pain. These impairments are limiting patient from cleaning, community activity, meal prep, and yard work. Personal factors including Fitness and Time since onset of injury/illness/exacerbation are also affecting patient's functional outcome. Patient will benefit from skilled PT to address above impairments and improve overall function.  REHAB POTENTIAL: Good  CLINICAL DECISION MAKING: Stable/uncomplicated  EVALUATION COMPLEXITY: Low   GOALS: Goals reviewed with patient? Yes  SHORT  TERM GOALS:  STG Name Target Date Goal status  1 Patient will be independent with initial HEP   01/11/22 INITIAL  2 Pt to report improved pain at night with sleep, sleeping at least 6 hours without waking from pain.  01/11/22 INITIAL         LONG TERM GOALS:   LTG Name Target Date Goal status  1 Patient will be independent with final HEP  02/08/22 INITIAL  2 Pt to  report decreased pain in Neck to 0-2/10 with sleeping and daily activities   02/08/22 INITIAL  3 Pt to demo ability to self correct posture in clinic, at least 75 % of the time.   02/08/22 INITIAL  4 Pt to demonstrate ability for full cervical ROM, to be WNL, without pain, to improve ability for driving and IADLs.   2/95/62 INITIAL  5          PLAN: PT FREQUENCY: 1-2x/week  PT DURATION: 6 weeks  PLANNED INTERVENTIONS: Therapeutic exercises, Therapeutic activity, Neuro Muscular re-education, Gait training, Patient/Family education, Joint mobilization, Stair training, DME instructions, Dry Needling, Electrical stimulation, Cryotherapy, Moist heat, Taping, Traction, Ultrasound, Ionotophoresis 4mg /ml Dexamethasone, and Manual therapy  PLAN FOR NEXT SESSION: Manual for neck, posture education and strengthening    , PT, DPT 12:47 PM  01/09/22

## 2022-01-10 ENCOUNTER — Encounter: Payer: BC Managed Care – PPO | Admitting: Physical Therapy

## 2022-01-17 ENCOUNTER — Encounter: Payer: BC Managed Care – PPO | Admitting: Physical Therapy

## 2022-01-19 ENCOUNTER — Ambulatory Visit: Payer: BC Managed Care – PPO | Admitting: Family Medicine

## 2022-02-16 ENCOUNTER — Other Ambulatory Visit: Payer: Self-pay | Admitting: Family Medicine

## 2022-02-21 ENCOUNTER — Other Ambulatory Visit: Payer: Self-pay | Admitting: Family Medicine

## 2022-03-13 ENCOUNTER — Other Ambulatory Visit: Payer: Self-pay | Admitting: Family Medicine

## 2022-03-23 ENCOUNTER — Ambulatory Visit: Payer: BC Managed Care – PPO | Admitting: Family

## 2022-03-23 ENCOUNTER — Encounter: Payer: Self-pay | Admitting: Family

## 2022-03-23 VITALS — BP 119/75 | HR 91 | Temp 98.2°F | Ht 68.0 in | Wt 199.1 lb

## 2022-03-23 DIAGNOSIS — J069 Acute upper respiratory infection, unspecified: Secondary | ICD-10-CM

## 2022-03-23 NOTE — Patient Instructions (Signed)
It was very nice to see you today! ? ?I have given you samples of Norel AD, take 1 pill twice a day to help relieve your sinus symptoms. ?Continue taking 2-3 Ibuprofen every 6 hours as needed for fever, sinus pressure, aches. ?Drink at least 2 liters water daily. ? ?Let us know if you are not getting better next week! ? ? ? ?PLEASE NOTE: ? ?If you had any lab tests please let us know if you have not heard back within a few days. You may see your results on MyChart before we have a chance to review them but we will give you a call once they are reviewed by [place]. If we ordered any referrals today, please let us know if you have not heard from their office within the next week.  ? ?

## 2022-03-23 NOTE — Progress Notes (Signed)
? ?Subjective:  ? ? ? Patient ID: Glenn Kirby, male    DOB: 03/19/1961, 61 y.o.   MRN: [id number] ? ?Chief Complaint  ?Patient presents with  ? Cough  ?  Pt c/o cough,sneeze,nasal congestion for about 5/2, Has tried ibuprofen which did reduce pain.   ? Headache  ? ?HPI: ?Upper Respiratory Infection: Symptoms include congestion, nasal congestion, no  fever, non productive cough, post nasal drip, and sore throat.  ?Onset of symptoms was 3 days ago, gradually worsening since that time. He is drinking plenty of fluids. Evaluation to date: none. Treatment to date: Ibuprofen.  ? ? ?Assessment & Plan:  ? ?Problem List Items Addressed This Visit   ?None ?Visit Diagnoses   ? ? Viral upper respiratory tract infection    -  Primary ?sx for 3 days, just taking Ibuprofen, states he has no sinus meds at home. Provided Norel AD samples for 4 days, take 1 pill bid, advised on use & SE. Drink at least 2L of water qd, can continue Ibuprofen for any fever, aches, sore throat, f/u if sx not improving by next week.   ? ?  ? ?Outpatient Medications Prior to Visit  ?Medication Sig Dispense Refill  ? clorazepate (TRANXENE) 3.75 MG tablet Take 1 tablet (3.75 mg total) by mouth 2 (two) times daily as needed for anxiety. 10 tablet 0  ? cyclobenzaprine (FLEXERIL) 10 MG tablet Take 1 tablet (10 mg total) by mouth 3 (three) times daily as needed for muscle spasms. 30 tablet 0  ? Diclofenac Sodium (PENNSAID) 2 % SOLN Place 1 application onto the skin 2 (two) times daily. 112 g 2  ? Doxepin HCl 6 MG TABS Take 1 tab 30 minutes before bed. 180 tablet 3  ? levothyroxine (SYNTHROID) 50 MCG tablet TAKE 1 TABLET(50 MCG) BY MOUTH DAILY BEFORE BREAKFAST 30 tablet 1  ? meclizine (ANTIVERT) 25 MG tablet Take 1 tablet (25 mg total) by mouth 3 (three) times daily as needed for dizziness. 15 tablet 0  ? meloxicam (MOBIC) 15 MG tablet TAKE 1 TABLET(15 MG) BY MOUTH DAILY 30 tablet 0  ? ?No facility-administered medications prior to visit.  ? ? ?No past medical  history on file. ? ?Past Surgical History:  ?Procedure Laterality Date  ? LEFT HEART CATHETERIZATION WITH CORONARY ANGIOGRAM N/A 05/12/2013  ? Procedure: LEFT HEART CATHETERIZATION WITH CORONARY ANGIOGRAM;  Surgeon: Robynn Pane, MD;  Location: [facility] CATH LAB;  Service: Cardiovascular;  Laterality: N/A;  ? ? ?No Known Allergies ?   ?Objective:  ?  ?Physical Exam ?Vitals and nursing note reviewed.  ?Constitutional:   ?   General: He is not in acute distress. ?   Appearance: Normal appearance.  ?HENT:  ?   Head: Normocephalic.  ?   Right Ear: Tympanic membrane and ear canal normal.  ?   Left Ear: Tympanic membrane and ear canal normal.  ?   Nose:  ?   Right Sinus: No maxillary sinus tenderness or frontal sinus tenderness.  ?   Left Sinus: No maxillary sinus tenderness or frontal sinus tenderness.  ?   Mouth/Throat:  ?   Mouth: Mucous membranes are moist.  ?   Pharynx: Oropharyngeal exudate and posterior oropharyngeal erythema (mild) present. No pharyngeal swelling or uvula swelling.  ?Cardiovascular:  ?   Rate and Rhythm: Normal rate and regular rhythm.  ?Pulmonary:  ?   Effort: Pulmonary effort is normal.  ?   Breath sounds: Normal breath sounds.  ?Musculoskeletal:     ?  General: Normal range of motion.  ?   Cervical back: Normal range of motion.  ?Skin: ?   General: Skin is warm and dry.  ?Neurological:  ?   Mental Status: He is alert and oriented to person, place, and time.  ?Psychiatric:     ?   Mood and Affect: Mood normal.  ? ? ?BP 119/75 (BP Location: Left Arm, Patient Position: Sitting, Cuff Size: Large)   Pulse 91   Temp 98.2 ?F (36.8 ?C) (Temporal)   Ht 5\' 8"  (1.727 m)   Wt 199 lb 2 oz (90.3 kg)   SpO2 95%   BMI 30.28 kg/m?  ?Wt Readings from Last 3 Encounters:  ?03/23/22 199 lb 2 oz (90.3 kg)  ?12/28/21 198 lb (89.8 kg)  ?12/08/21 197 lb (89.4 kg)  ?   ? ?12/10/21, NP ? ?

## 2022-03-27 ENCOUNTER — Ambulatory Visit: Payer: BC Managed Care – PPO | Admitting: Family Medicine

## 2022-03-27 VITALS — BP 122/86 | HR 82 | Temp 98.3°F | Ht 68.0 in | Wt 199.6 lb

## 2022-03-27 DIAGNOSIS — G4700 Insomnia, unspecified: Secondary | ICD-10-CM

## 2022-03-27 DIAGNOSIS — J329 Chronic sinusitis, unspecified: Secondary | ICD-10-CM | POA: Diagnosis not present

## 2022-03-27 DIAGNOSIS — M542 Cervicalgia: Secondary | ICD-10-CM | POA: Diagnosis not present

## 2022-03-27 MED ORDER — AZELASTINE HCL 0.1 % NA SOLN: 2.0000 | Freq: Two times a day (BID) | NASAL | 12 refills | Status: DC

## 2022-03-27 MED ORDER — AMOXICILLIN-POT CLAVULANATE 875-125 MG PO TABS: 1.0000 | ORAL_TABLET | Freq: Two times a day (BID) | ORAL | 0 refills | Status: DC

## 2022-03-27 NOTE — Patient Instructions (Signed)
It was very nice to see you today! ? ?Your lungs sound good today.  You do not have a pneumonia or bronchitis. ? ?You probably have a sinus infection.  Please start the nasal spray and antibiotic.  Let me know if not improving by next week. ? ?Take care, ?Dr [name] ? ?PLEASE NOTE: ? ?If you had any lab tests please let us know if you have not heard back within a few days. You may see your results on mychart before we have a chance to review them but we will give you a call once they are reviewed by [place]. If we ordered any referrals today, please let us know if you have not heard from their office within the next week.  ? ?Please try these tips to maintain a healthy lifestyle: ? ?Eat at least 3 REAL meals and 1-2 snacks per day.  Aim for no more than 5 hours between eating.  If you eat breakfast, please do so within one hour of getting up.  ? ?Each meal should contain half fruits/vegetables, one quarter protein, and one quarter carbs (no bigger than a computer mouse) ? ?Cut down on sweet beverages. This includes juice, soda, and sweet tea.  ? ?Drink at least 1 glass of water with each meal and aim for at least 8 glasses per day ? ?Exercise at least 150 minutes every week.   ?

## 2022-03-27 NOTE — Assessment & Plan Note (Addendum)
Stable on doxepin 6mg  nightly as needed. Does not need refill today.  ?

## 2022-03-27 NOTE — Assessment & Plan Note (Signed)
Doing well with meloxicam as needed. Does not need refill today.  ?

## 2022-03-27 NOTE — Progress Notes (Signed)
? ?  Glenn Kirby is a 61 y.o. male who presents today for an office visit. ? ?Assessment/Plan:  ?New/Acute Problems: ?Sinusitis ?No red flags.  Given length of symptoms we will start Augmentin.  Also start Astelin.  Reassuring lung exam today without any signs of bronchitis or pneumonia.  Reassured patient he likely does not have valley fever.  He will continue taking over-the-counter meds as needed.  Encourage good hydration.  If not improving by next week would consider chest x-ray.  We discussed reasons to return to care.  Follow-up as needed. ? ?Chronic Problems Addressed Today: ?Insomnia ?Stable on doxepin 6mg  nightly as needed. Does not need refill today.  ? ?Neck pain ?Doing well with meloxicam as needed. Does not need refill today.  ? ? ?  ?Subjective:  ?HPI: ? ?Patient here with cough and sneeze for the last 8 days. Took a covid test 2 days ago which was negative. He was recently in [place] and is concerned he may have valley fever. Breathing is normal. No chest pain or shortness of breath. No night sweats. No nausea or vomiting.   ? ?   ?  ?Objective:  ?Physical Exam: ?BP 122/86 (BP Location: Left Arm)   Pulse 82   Temp 98.3 ?F (36.8 ?C) (Temporal)   Ht 5\' 8"  (1.727 m)   Wt 199 lb 9.6 oz (90.5 kg)   SpO2 95%   BMI 30.35 kg/m?   ?Gen: No acute distress, resting comfortably ?HEENT: TMs clear.  Nose mucosa erythematous and boggy with clear discharge.  OP clear. ?CV: Regular rate and rhythm with no murmurs appreciated ?Pulm: Normal work of breathing, clear to auscultation bilaterally with no crackles, wheezes, or rhonchi ?Neuro: Grossly normal, moves all extremities ?Psych: Normal affect and thought content ? ?   ? ?[place]. , MD ?03/27/2022 9:56 AM  ?

## 2022-04-10 ENCOUNTER — Other Ambulatory Visit: Payer: Self-pay | Admitting: *Deleted

## 2022-04-10 MED ORDER — MELOXICAM 15 MG PO TABS: ORAL_TABLET | ORAL | 0 refills | Status: DC

## 2022-07-11 ENCOUNTER — Ambulatory Visit: Payer: BC Managed Care – PPO | Admitting: Physician Assistant

## 2022-07-11 ENCOUNTER — Encounter: Payer: Self-pay | Admitting: Physician Assistant

## 2022-07-11 VITALS — BP 110/70 | HR 71 | Temp 98.0°F | Ht 68.0 in | Wt 199.4 lb

## 2022-07-11 DIAGNOSIS — H9203 Otalgia, bilateral: Secondary | ICD-10-CM | POA: Diagnosis not present

## 2022-07-11 DIAGNOSIS — G4700 Insomnia, unspecified: Secondary | ICD-10-CM

## 2022-07-11 DIAGNOSIS — M542 Cervicalgia: Secondary | ICD-10-CM

## 2022-07-11 DIAGNOSIS — H8110 Benign paroxysmal vertigo, unspecified ear: Secondary | ICD-10-CM

## 2022-07-11 MED ORDER — CLORAZEPATE DIPOTASSIUM 3.75 MG PO TABS: 3.7500 mg | ORAL_TABLET | Freq: Two times a day (BID) | ORAL | 0 refills | Status: DC | PRN

## 2022-07-11 MED ORDER — MELOXICAM 15 MG PO TABS: ORAL_TABLET | ORAL | 0 refills | Status: DC

## 2022-07-11 NOTE — Patient Instructions (Signed)
It was great to see you!  I'm placing referral to physical therapy for your vertigo.  I have refilled your medications.  If you get a fever or have worsening symptoms, please let me know.  Take care,  Jarold Motto PA-C

## 2022-07-11 NOTE — Progress Notes (Signed)
Glenn Kirby is a 61 y.o. male here for a follow up of a pre-existing problem.  History of Present Illness:   Chief Complaint  Patient presents with   Otalgia    Pt c/o bilateral ear pain x 3 days and now some dizziness, took Meclizine an dizziness is better.    HPI  Insomnia Currently taking clorazepate 3.75 mg BID for anxiety and insomnia. This works well for him. Denies SI/HI. Needs refill.   Neck pain Takes mobic 15 mg daily. Tolerating well. Needs refill on this. Denies new sx.   Vertigo Has had vertigo in the past. Typically takes meclizine for sx relief. Denies chest pain, SOB, palpitations. Has had recent flare of this in the past 3 days.   Bilateral ear pain Has had some issues with bilateral ear pain. Ears feel full. Has not tried anything for his sx. Denies: fevers, chills, severe pain, recent travel or URI.    History reviewed. No pertinent past medical history.   Social History   Tobacco Use   Smoking status: Never   Smokeless tobacco: Never  Vaping Use   Vaping Use: Never used  Substance Use Topics   Alcohol use: No   Drug use: Never    Past Surgical History:  Procedure Laterality Date   LEFT HEART CATHETERIZATION WITH CORONARY ANGIOGRAM N/A 05/12/2013   Procedure: LEFT HEART CATHETERIZATION WITH CORONARY ANGIOGRAM;  Surgeon: Robynn Pane, MD;  Location: [facility];  Service: Cardiovascular;  Laterality: N/A;    Family History  Problem Relation Age of Onset   Cancer Neg Hx     No Known Allergies  Current Medications:   Current Outpatient Medications:    clorazepate (TRANXENE) 3.75 MG tablet, Take 1 tablet (3.75 mg total) by mouth 2 (two) times daily as needed for anxiety., Disp: 10 tablet, Rfl: 0   levothyroxine (SYNTHROID) 50 MCG tablet, TAKE 1 TABLET(50 MCG) BY MOUTH DAILY BEFORE BREAKFAST, Disp: 30 tablet, Rfl: 1   meclizine (ANTIVERT) 25 MG tablet, Take 1 tablet (25 mg total) by mouth 3 (three) times daily as needed for dizziness.,  Disp: 15 tablet, Rfl: 0   meloxicam (MOBIC) 15 MG tablet, TAKE 1 TABLET(15 MG) BY MOUTH DAILY, Disp: 30 tablet, Rfl: 0   Review of Systems:   ROS Negative unless otherwise specified per HPI.  Vitals:   Vitals:   07/11/22 1032  BP: 110/70  Pulse: 71  Temp: 98 F (36.7 C)  TempSrc: Temporal  SpO2: 95%  Weight: 199 lb 6.1 oz (90.4 kg)  Height: 5\' 8"  (1.727 m)     Body mass index is 30.32 kg/m.  Physical Exam:   Physical Exam Vitals and nursing note reviewed.  Constitutional:      General: He is not in acute distress.    Appearance: He is well-developed. He is not ill-appearing or toxic-appearing.  HENT:     Head: Normocephalic and atraumatic.     Right Ear: Ear canal and external ear normal. A middle ear effusion is present. Tympanic membrane is not erythematous, retracted or bulging.     Left Ear: Ear canal and external ear normal. A middle ear effusion is present. Tympanic membrane is not erythematous, retracted or bulging.     Ears:     Comments: Very mild b/l mastoid tenderness    Nose: Nose normal.     Right Sinus: No maxillary sinus tenderness or frontal sinus tenderness.     Left Sinus: No maxillary sinus tenderness or frontal sinus tenderness.  Mouth/Throat:     Pharynx: Uvula midline. No posterior oropharyngeal erythema.  Eyes:     General: Lids are normal.     Conjunctiva/sclera: Conjunctivae normal.  Neck:     Trachea: Trachea normal.  Cardiovascular:     Rate and Rhythm: Normal rate and regular rhythm.     Heart sounds: Normal heart sounds, S1 normal and S2 normal.  Pulmonary:     Effort: Pulmonary effort is normal.     Breath sounds: Normal breath sounds. No decreased breath sounds, wheezing, rhonchi or rales.  Lymphadenopathy:     Cervical: No cervical adenopathy.  Skin:    General: Skin is warm and dry.  Neurological:     Mental Status: He is alert.  Psychiatric:        Speech: Speech normal.        Behavior: Behavior normal. Behavior is  cooperative.     Assessment and Plan:   Insomnia, unspecified type Well controlled with medication Will provide short-term rx with tranxene 3.75 mg BID Further refills must come from PCP  Neck pain No red flags Refill mobic 15 mg daily Follow-up with PCP if new/worsening sx  Benign paroxysmal positional vertigo, unspecified laterality No red flags Referral for vestibular rehab to help adequately treat this If new/worsening sx, needs to seek in person care  Otalgia of both ears No red flags Vitals stable Bilateral ears with effusion -- consider nasal spray and/or antihistamine No need for abx at this time Continue to monitor sx and if anything worsens, close follow-up Low suspicion for mastoiditis, however if sx worsens, was told to follow-up  Jarold Motto, PA-C

## 2022-07-17 ENCOUNTER — Ambulatory Visit: Payer: BC Managed Care – PPO | Attending: Physician Assistant | Admitting: Physical Therapy

## 2022-07-17 ENCOUNTER — Other Ambulatory Visit: Payer: Self-pay

## 2022-07-17 ENCOUNTER — Encounter: Payer: Self-pay | Admitting: Physical Therapy

## 2022-07-17 DIAGNOSIS — H8110 Benign paroxysmal vertigo, unspecified ear: Secondary | ICD-10-CM | POA: Insufficient documentation

## 2022-07-17 DIAGNOSIS — R42 Dizziness and giddiness: Secondary | ICD-10-CM | POA: Insufficient documentation

## 2022-07-17 NOTE — Therapy (Unsigned)
OUTPATIENT PHYSICAL THERAPY VESTIBULAR EVALUATION     Patient Name: Glenn Kirby MRN: [id number] DOB:October 28, 1961, 61 y.o., male Today's Date: 07/18/2022  PCP: Jacquiline Doe, MD REFERRING PROVIDER: Jarold Motto, PA   PT End of Session - 07/17/22 [phone]     Visit Number 1    Number of Visits 8    Date for PT Re-Evaluation 08/17/22    Authorization Type BCBS    PT Start Time [phone]    PT Stop Time 1017    PT Time Calculation (min) 43 min    Activity Tolerance Patient tolerated treatment well    Behavior During Therapy [facility] for tasks assessed/performed             History reviewed. No pertinent past medical history. 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 No  PRECAUTIONS: None  WEIGHT BEARING RESTRICTIONS No  FALLS: Has patient fallen in last 6 months? No  PLOF: Independent  PATIENT GOALS To get rid of dizziness  OBJECTIVE:   TRANSFERS: Assistive device utilized: None  Sit to stand: Modified independence Stand to sit: Modified  independence  GAIT: Gait pattern: step through pattern Distance walked: clinic distances Assistive device utilized: None Level of assistance: Complete Independence Comments: Guarded gait pattern  PATIENT SURVEYS:  FOTO 39 at intake; goal = 61   VESTIBULAR ASSESSMENT   GENERAL OBSERVATION: See below    SYMPTOM BEHAVIOR:   Subjective history: Reports about 1 week of symptoms, waking up with spinning and dizziness.    Has happened before and had medication (Meclizine).  Took that for a few days and woke up fine today.   Non-Vestibular symptoms:  none   Type of dizziness: Spinning/Vertigo   Frequency: upon getting up from bed each morning, several mornings in a row   Duration: minutes   Aggravating factors: Induced by position change: rolling to the left and supine to sit and Induced by motion: looking up at the ceiling and looking up   Relieving factors: lying supine and Meclizine   Progression of symptoms: better   OCULOMOTOR EXAM:   Ocular Alignment: normal   Ocular ROM: No Limitations   Spontaneous Nystagmus: absent   Gaze-Induced Nystagmus: absent   Smooth Pursuits: intact   Saccades: intact   Convergence/Divergence: WFL cm     VESTIBULAR - OCULAR REFLEX:    Slow VOR: Comment: normal, c/o slight dizziness   VOR Cancellation: Comment: stops midline initially, c/o slight dizziness   Head-Impulse Test: HIT Right: negative HIT Left: negative   Dynamic Visual Acuity: Static: Line 4 Dynamic: Line 4  (not wearing glasses)    POSITIONAL TESTING: Right Dix-Hallpike: upbeating,  right nystagmus, right upbeating, and Duration: 30 sec Left Dix-Hallpike: nystagmus (eyes generally moving around) and Duration: 30 sec Right Roll Test: postiive for reports of spinning Left Roll Test: no nystagmus Other: dizziness upon sitting up from [facility]  and from LDH    MOTION SENSITIVITY:    Motion Sensitivity Quotient  Intensity: 0 = none, 1 = Lightheaded, 2 = Mild, 3 = Moderate, 4 = Severe,  5 = Vomiting  Intensity  1. Sitting to supine   2. Supine to L side   3. Supine to R side   4. Supine to sitting   5. L Hallpike-Dix   6. Up from L    7. R Hallpike-Dix   8. Up from R    9. Sitting, head  tipped to L knee   10. Head up from L  knee   11. Sitting, head  tipped to R knee   12. Head up from R  knee   13. Sitting head turns x5   14.Sitting head nods x5   15. In stance, 180  turn to L    16. In stance, 180  turn to R       VESTIBULAR TREATMENT:  Canalith Repositioning:   Epley Right: Number of Reps: 1, Response to Treatment: symptoms improved, and Comment: No nystagmus or symptoms noted with 2nd dix-hallpike; however, he does report dizziness upon return to sit (5-6/10) (First rating was 7-8/10)  PATIENT EDUCATION: Education details: Educated in PT eval results, POC Person educated: Patient Education method: Explanation Education comprehension: verbalized understanding   GOALS: Goals reviewed with patient? Yes  SHORT TERM GOALS: = LTGs  LONG TERM GOALS: Target date: 08/17/2022    Pt will be independent with HEP for decreased dizziness and improved bed mobility. Baseline:  Goal status: INITIAL  2.  Pt will have no c/o dizziness with bed mobility tasks. Baseline:  Goal status: INITIAL  3.  Pt will improve FOTO score to at least 61 for improved overall functional mobility. Baseline: 39 Goal status: INITIAL  ASSESSMENT:  CLINICAL IMPRESSION: Patient is a 61 y.o. male who was seen today for physical therapy evaluation and treatment for BPPV.  Pt has recent onset c/o spinning sensations with attempts to get out of bed in the mornings, x approximately one week.   With positional testing, pt c/o dizziness with multiple positions (R and L [employer] and R roll test) as well as return to sit from these positions;  however, nystagmus is not consistent with each position.  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  Pt's c/o of dizziness were worst  with R Dix-Hallpike, so treatment initiated with Epley for R side.  Pt with noted improvement in symptoms and no nystagmus on second attempt at R Dix-Hallpike.  He will benefit from skilled PT to address the above stated vestibular deficits to decrease dizziness and improve overall functional mobility.   OBJECTIVE IMPAIRMENTS difficulty walking and dizziness.   ACTIVITY LIMITATIONS bending, bed mobility, and locomotion level  PARTICIPATION LIMITATIONS: community activity  PERSONAL FACTORS 1-2 comorbidities: see above  are also affecting patient's functional outcome.   REHAB POTENTIAL: Good  CLINICAL DECISION MAKING: Evolving/moderate complexity  EVALUATION COMPLEXITY: Moderate   PLAN: PT FREQUENCY: 2x/week  PT DURATION: 4 weeks plus eval week  PLANNED INTERVENTIONS: Therapeutic exercises, Therapeutic activity, Neuromuscular re-education, Balance training, Gait training, Patient/Family education, Self Care, Vestibular training, and Canalith repositioning  PLAN FOR NEXT SESSION: Re-assess positional vertigo and treat as appropriate.  Establish  HEP for habituation   Gean Maidens., PT 07/18/2022, 4:42 PM   [facility] [address] [place], [address] [place], [place] [address] Phone # [phone] Fax # [phone]

## 2022-07-19 ENCOUNTER — Ambulatory Visit: Payer: BC Managed Care – PPO | Admitting: Physical Therapy

## 2022-10-05 IMAGING — DX DG CERVICAL SPINE COMPLETE 4+V
5 series · 5 of 5 positions shown · non-contrast
Comparison: None.

CLINICAL DATA: Neck pain

EXAM:
CERVICAL SPINE - COMPLETE 4+ VIEW

[c-spine lat]
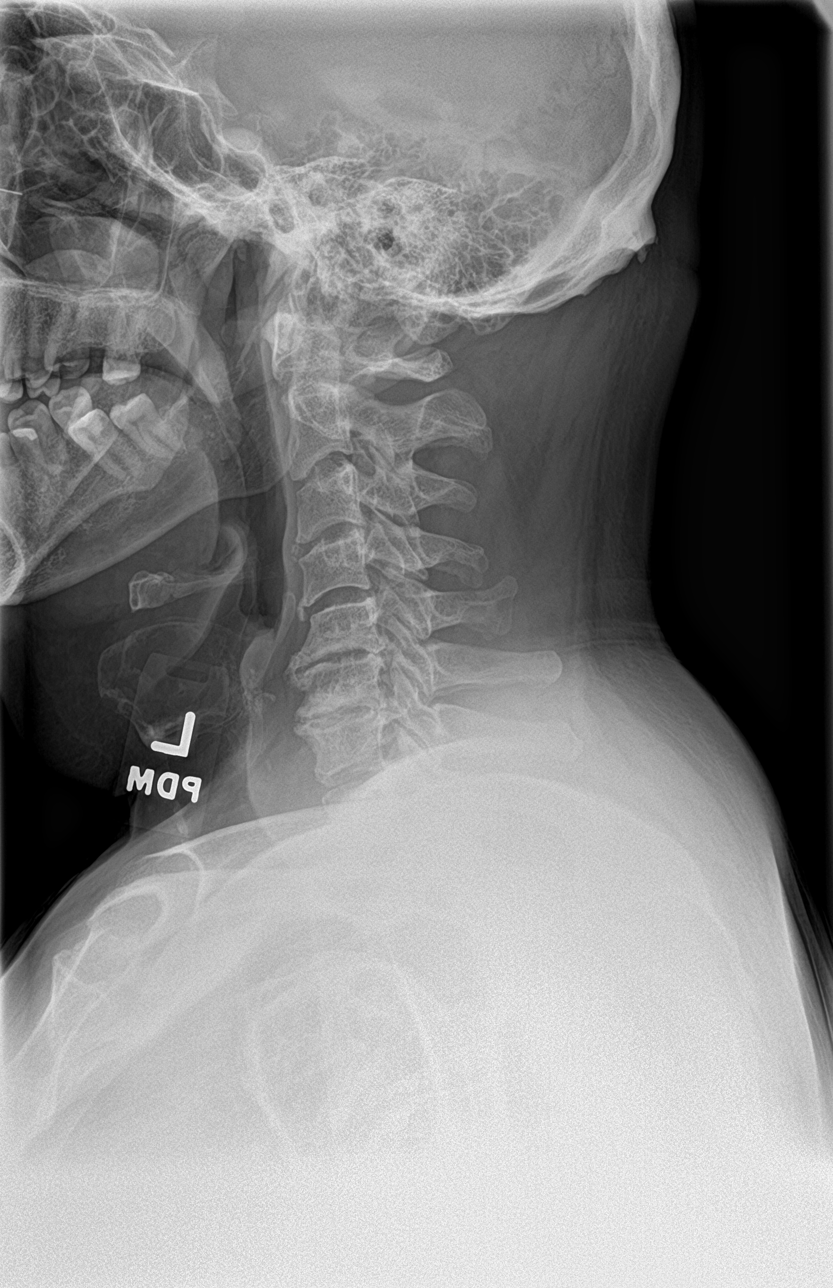

[c-spine obl (1 of 2)]
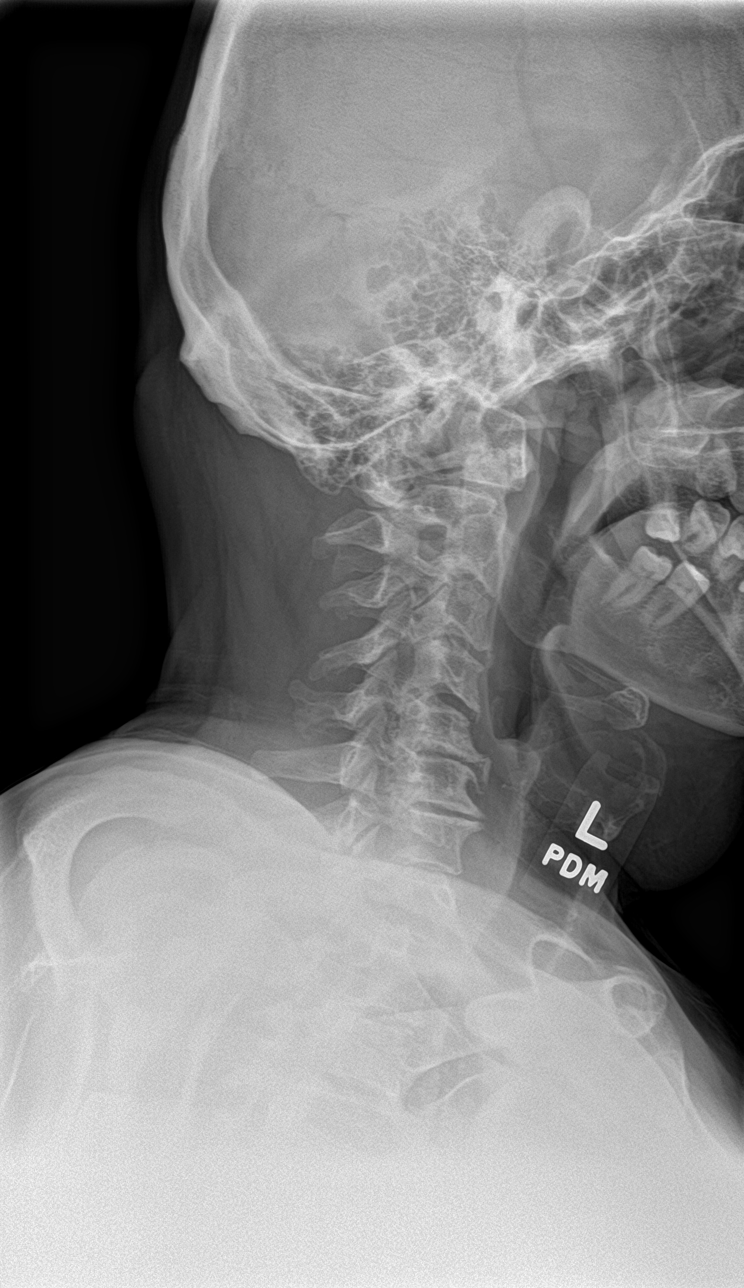

[c-spine obl (2 of 2)]
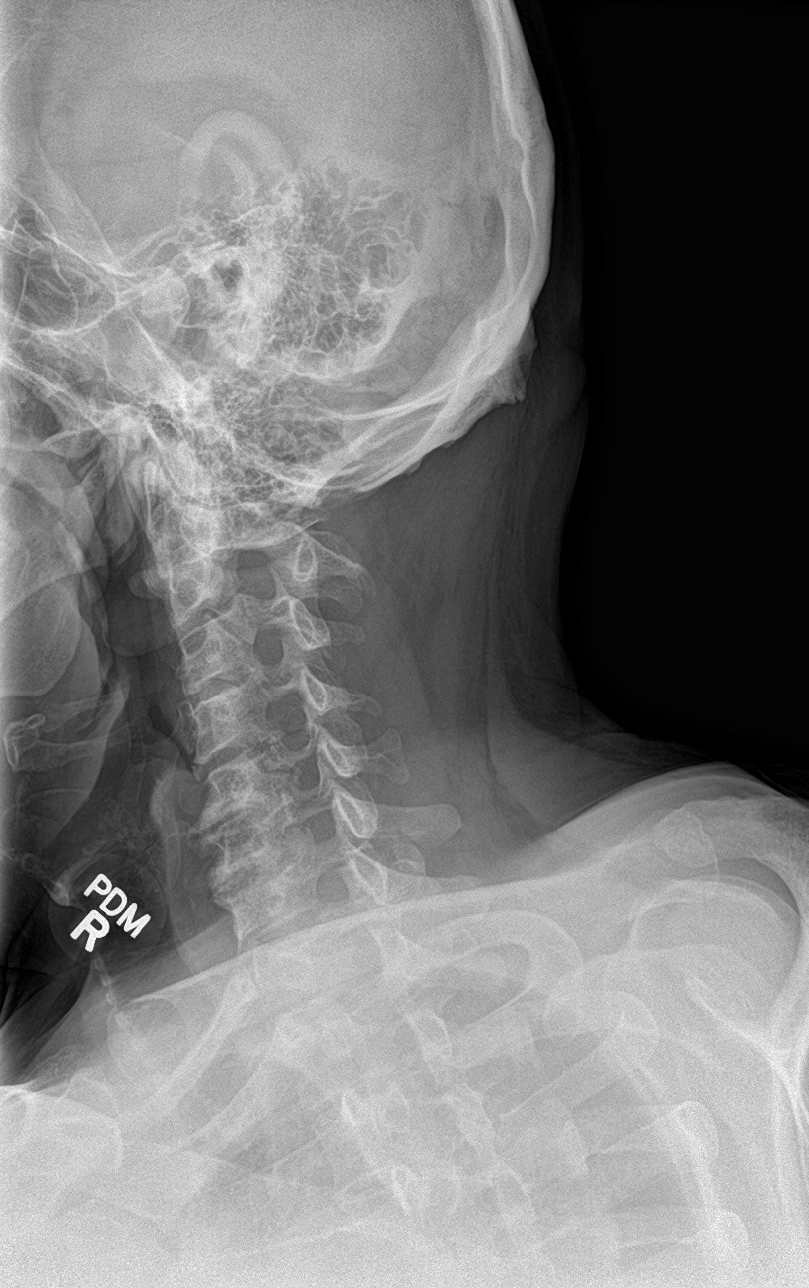

[c-spine ap]
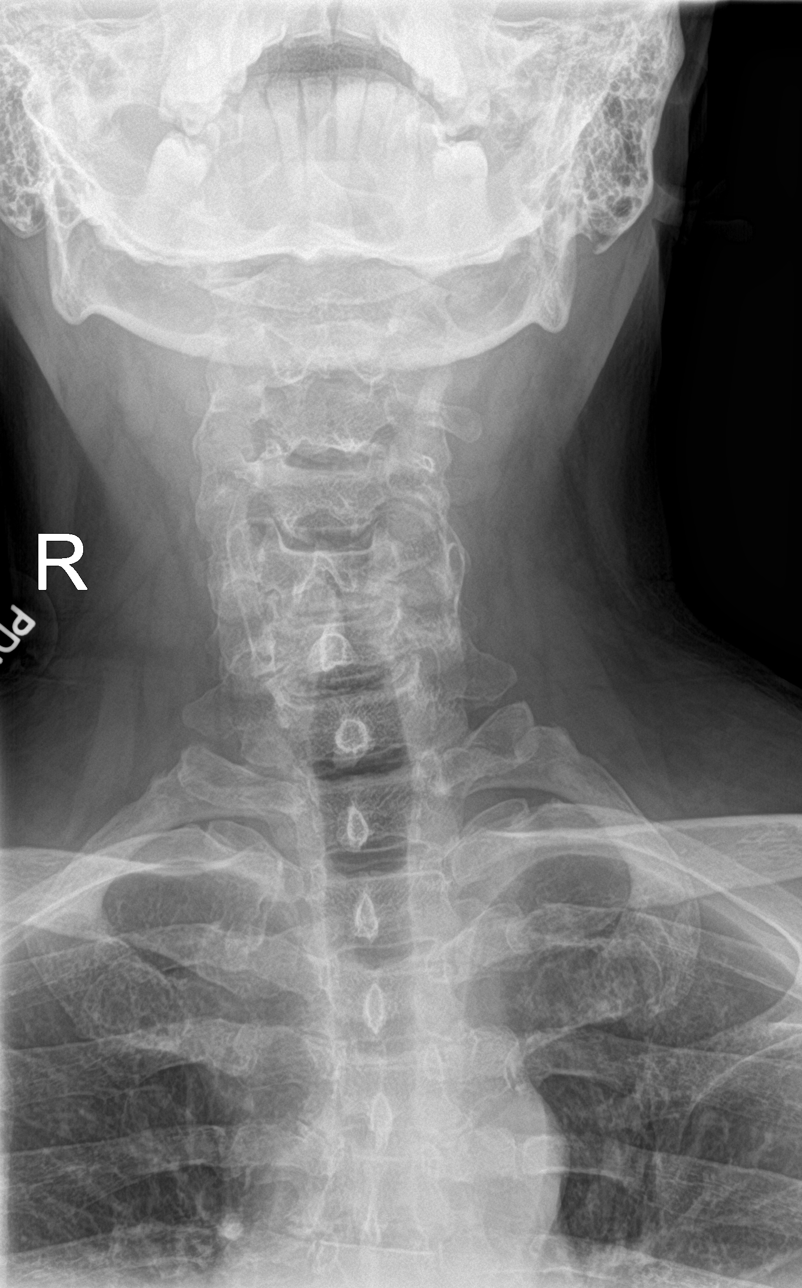

[c-spine open mouth]
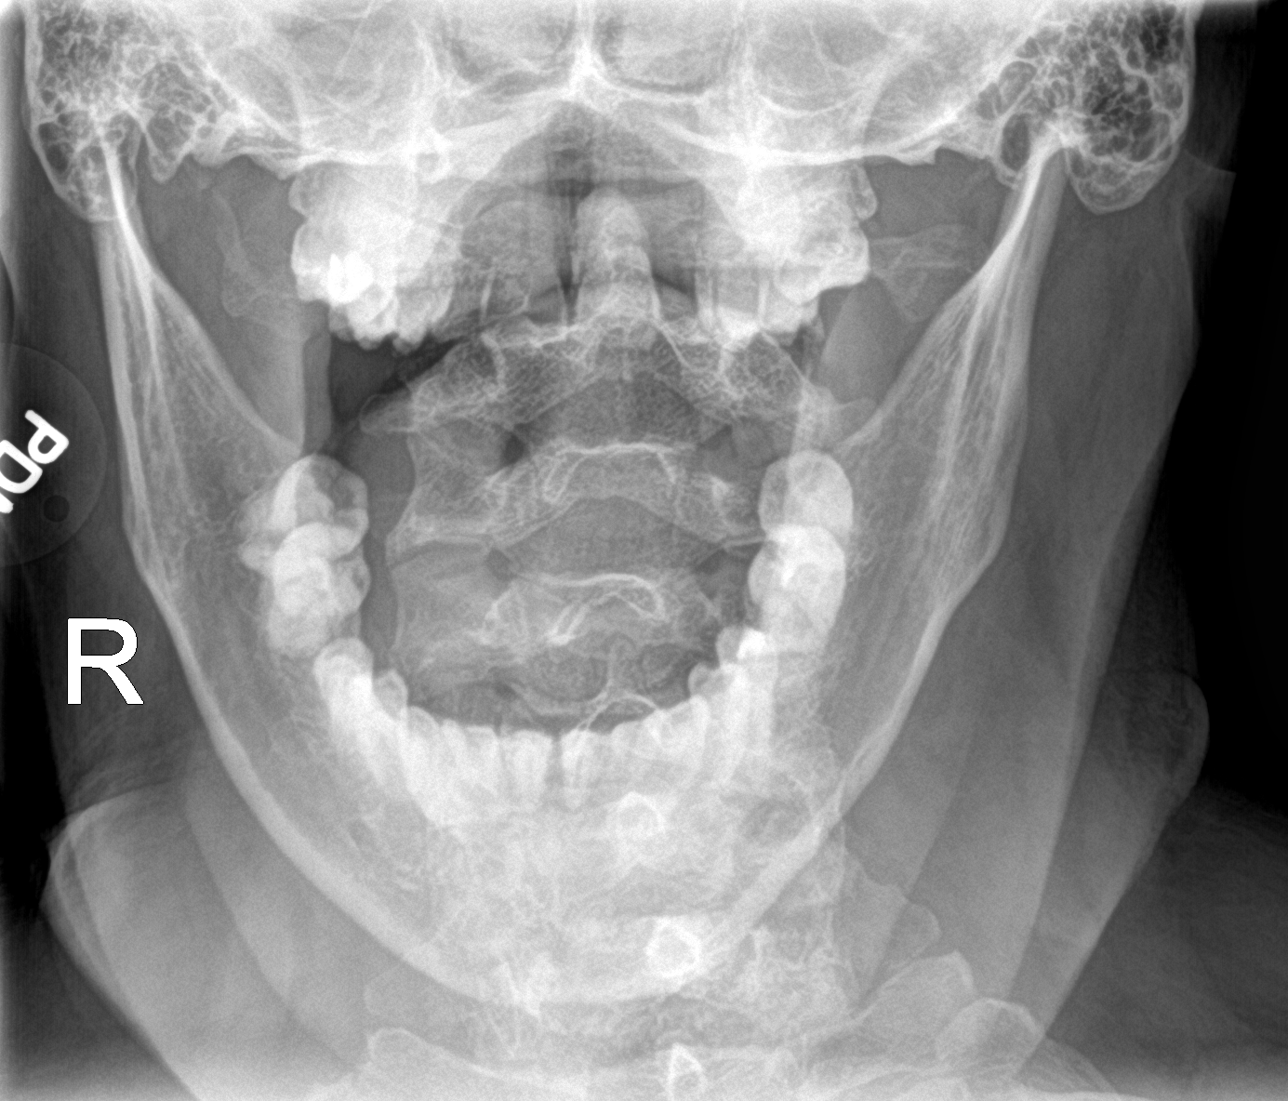

[5 of 5 positions shown; findings below may reference images not displayed]

FINDINGS: On the lateral view the cervical spine is visualized to the level of
C7. Straightening of the normal cervical lordosis likely due to
positioning and degenerative changes. Alignment is otherwise normal.

Dens is well positioned between the lateral masses of C1. There is
limited evaluation of the dens for acute fracture on the open-mouth
view due to overlying osseous structures.

Multilevel degenerative changes most prominent at the C5-C7 levels.
Query severe osseous neural foraminal stenosis on the left. No acute
displaced fracture is detected.No aggressive-appearing focal osseous
lesions.

Pre-vertebral soft tissues are within normal limits.
IMPRESSION: 1. Multilevel degenerative changes most prominent at the C5-C7
levels. Query severe osseous neural foraminal stenosis on the left.
Limited evaluation due to overlapping osseous structures and
overlying soft tissues.
2. No acute displaced fracture or traumatic listhesis of the
cervical spine.

## 2022-11-07 ENCOUNTER — Other Ambulatory Visit: Payer: Self-pay | Admitting: Physician Assistant

## 2022-12-06 ENCOUNTER — Encounter: Payer: Self-pay | Admitting: Family Medicine

## 2022-12-06 ENCOUNTER — Other Ambulatory Visit: Payer: Self-pay | Admitting: Physician Assistant

## 2022-12-06 MED ORDER — MELOXICAM 15 MG PO TABS: ORAL_TABLET | ORAL | 0 refills | Status: DC

## 2022-12-06 NOTE — Telephone Encounter (Signed)
Spoke with Patient. Refused any type of visit.

## 2022-12-06 NOTE — Telephone Encounter (Signed)
Spoke with patient, patient refused to schedule and office visit or virtual visit  Requesting antibiotic, patient notified need and office visit  Was advising patient can also go to mychart UC, patient disconnect call before I can finish telling him about the Virtual UC

## 2023-05-03 ENCOUNTER — Other Ambulatory Visit: Payer: Self-pay | Admitting: Physician Assistant

## 2023-05-06 ENCOUNTER — Other Ambulatory Visit: Payer: Self-pay | Admitting: Physician Assistant

## 2023-07-08 ENCOUNTER — Other Ambulatory Visit: Payer: Self-pay | Admitting: Family Medicine

## 2023-07-11 ENCOUNTER — Encounter: Payer: Self-pay | Admitting: Family Medicine

## 2023-07-11 ENCOUNTER — Ambulatory Visit: Payer: BC Managed Care – PPO | Admitting: Family Medicine

## 2023-07-11 VITALS — BP 131/72 | HR 80 | Temp 97.5°F | Ht 68.0 in | Wt 202.0 lb

## 2023-07-11 DIAGNOSIS — R739 Hyperglycemia, unspecified: Secondary | ICD-10-CM | POA: Diagnosis not present

## 2023-07-11 DIAGNOSIS — G4700 Insomnia, unspecified: Secondary | ICD-10-CM | POA: Diagnosis not present

## 2023-07-11 DIAGNOSIS — F439 Reaction to severe stress, unspecified: Secondary | ICD-10-CM | POA: Diagnosis not present

## 2023-07-11 DIAGNOSIS — E785 Hyperlipidemia, unspecified: Secondary | ICD-10-CM

## 2023-07-11 DIAGNOSIS — E038 Other specified hypothyroidism: Secondary | ICD-10-CM

## 2023-07-11 DIAGNOSIS — M542 Cervicalgia: Secondary | ICD-10-CM

## 2023-07-11 LAB — COMPREHENSIVE METABOLIC PANEL
ALT: 19 U/L (ref 0–53)
AST: 22 U/L (ref 0–37)
Albumin: 4.1 g/dL (ref 3.5–5.2)
Alkaline Phosphatase: 64 U/L (ref 39–117)
BUN: 19 mg/dL (ref 6–23)
CO2: 26 meq/L (ref 19–32)
Calcium: 9.5 mg/dL (ref 8.4–10.5)
Chloride: 103 meq/L (ref 96–112)
Creatinine, Ser: 0.95 mg/dL (ref 0.40–1.50)
GFR: 85.58 mL/min (ref >60.00)
Glucose, Bld: 97 mg/dL (ref 70–99)
Potassium: 4.1 meq/L (ref 3.5–5.1)
Sodium: 135 meq/L (ref 135–145)
Total Bilirubin: 0.6 mg/dL (ref 0.2–1.2)
Total Protein: 7.2 g/dL (ref 6.0–8.3)

## 2023-07-11 LAB — CBC
HCT: 46.1 % (ref 39.0–52.0)
Hemoglobin: 15.3 g/dL (ref 13.0–17.0)
MCHC: 33.1 g/dL (ref 30.0–36.0)
MCV: 89.1 fl (ref 78.0–100.0)
Platelets: 268 10*3/uL (ref 150.0–400.0)
RBC: 5.18 Mil/uL (ref 4.22–5.81)
RDW: 13.4 % (ref 11.5–15.5)
WBC: 3.6 10*3/uL — ABNORMAL LOW (ref 4.0–10.5)

## 2023-07-11 LAB — LIPID PANEL
Cholesterol: 172 mg/dL (ref 0–200)
HDL: 42.3 mg/dL (ref >39.00)
NonHDL: 129.7
Total CHOL/HDL Ratio: 4
Triglycerides: 230 mg/dL — ABNORMAL HIGH (ref 0.0–149.0)
VLDL: 46 mg/dL — ABNORMAL HIGH (ref 0.0–40.0)

## 2023-07-11 LAB — LDL CHOLESTEROL, DIRECT: Direct LDL: 121 mg/dL

## 2023-07-11 LAB — HEMOGLOBIN A1C: Hgb A1c MFr Bld: 5.6 % (ref 4.6–6.5)

## 2023-07-11 MED ORDER — MELOXICAM 15 MG PO TABS: ORAL_TABLET | ORAL | 0 refills | Status: DC

## 2023-07-11 MED ORDER — LEVOTHYROXINE SODIUM 50 MCG PO TABS: 50.0000 ug | ORAL_TABLET | Freq: Every day | ORAL | 3 refills | Status: DC

## 2023-07-11 MED ORDER — DOXEPIN HCL 10 MG PO CAPS: 10.0000 mg | ORAL_CAPSULE | Freq: Every day | ORAL | 3 refills | Status: DC

## 2023-07-11 MED ORDER — CLORAZEPATE DIPOTASSIUM 3.75 MG PO TABS: 3.7500 mg | ORAL_TABLET | Freq: Two times a day (BID) | ORAL | 0 refills | Status: DC | PRN

## 2023-07-11 NOTE — Assessment & Plan Note (Signed)
Will be refilling meloxicam as above for wrist wrist pain though this should hopefully help some with his neck pain as well.

## 2023-07-11 NOTE — Assessment & Plan Note (Signed)
Anxiety is worsened recently.  Will continue Tranxene and refill today.  Database without red flags.  We will restart doxepin.  He has done well with this previously.  Will also place referral to see therapist.  No reported SI or HI.  He will follow-up with me in a couple weeks via MyChart.

## 2023-07-11 NOTE — Assessment & Plan Note (Signed)
Check lipids 

## 2023-07-11 NOTE — Assessment & Plan Note (Signed)
Check TSH.  Continue Synthroid 50 mcg daily for now.

## 2023-07-11 NOTE — Assessment & Plan Note (Signed)
Uncontrolled recently as well.  No over-the-counter meds and remedies have not helped.  Will restart doxepin 10 mg nightly which works well for him.

## 2023-07-11 NOTE — Patient Instructions (Signed)
It was very nice to see you today!  We will restart your doxepin.  I will refill your other medications.  I will also refer you to see a therapist.  Let me know in a few weeks how you are doing with this.  I think you have tendinitis in your wrist.  Please continue wearing the brace.  Start the meloxicam.  Work on the exercises and let us know if not improving in a few weeks.  Return in about 2 weeks (around 07/25/2023).   Take care, Dr [name]  PLEASE NOTE:  If you had any lab tests, please let us know if you have not heard back within a few days. You may see your results on mychart before we have a chance to review them but we will give you a call once they are reviewed by [place].   If we ordered any referrals today, please let us know if you have not heard from their office within the next week.   If you had any urgent prescriptions sent in today, please check with the pharmacy within an hour of our visit to make sure the prescription was transmitted appropriately.   Please try these tips to maintain a healthy lifestyle:  Eat at least 3 REAL meals and 1-2 snacks per day.  Aim for no more than 5 hours between eating.  If you eat breakfast, please do so within one hour of getting up.   Each meal should contain half fruits/vegetables, one quarter protein, and one quarter carbs (no bigger than a computer mouse)  Cut down on sweet beverages. This includes juice, soda, and sweet tea.   Drink at least 1 glass of water with each meal and aim for at least 8 glasses per day  Exercise at least 150 minutes every week.

## 2023-07-11 NOTE — Assessment & Plan Note (Signed)
Check A1c. 

## 2023-07-11 NOTE — Progress Notes (Signed)
   Glenn Kirby is a 62 y.o. male who presents today for an office visit.  Assessment/Plan:  New/Acute Problems: Wrist pain Consistent with de Quervain's tenosynovitis.  He will continue his thumb spica splint.  We discussed home exercises and handout was given.  Will also start meloxicam.  He will let us know if not improving in the next few weeks and would consider referral to PT or sports medicine at that time.  Chronic Problems Addressed Today: Stress Anxiety is worsened recently.  Will continue Tranxene and refill today.  Database without red flags.  We will restart doxepin.  He has done well with this previously.  Will also place referral to see therapist.  No reported SI or HI.  He will follow-up with me in a couple weeks via MyChart.  Insomnia Uncontrolled recently as well.  No over-the-counter meds and remedies have not helped.  Will restart doxepin 10 mg nightly which works well for him.  Dyslipidemia Check lipids.  Hyperglycemia Check A1c.  Subclinical hypothyroidism Check TSH.  Continue Synthroid 50 mcg daily for now.  Neck pain Will be refilling meloxicam as above for wrist wrist pain though this should hopefully help some with his neck pain as well.     Subjective:  HPI:  See Assessment / plan for status of chronic conditions. Patient last seen over a year ago.  He has a few additional issues he like to discuss today.  About 10 days ago he started having sudden pain on his left hand. No obvious injuries or precipitating events. He tried using some essential oils without much improvement.  Tried using a splint which has helped some.  No over-the-counter meds tried he is trying avoid medications.  He has never had any like this before.  Also has had worsening anxiety.  His wife for the last couple weeks.  Symptoms seem to be worse at night.  Having worsening insomnia as well.  He stopped taking doxepin several months ago and thinks this may have been contributing.   He has not had any extra stress at home or at work.  He has been using his Tranxene as needed needs a refill on this.  This works well to help manage his symptoms.  No significant side effects.        Objective:  Physical Exam: BP 131/72   Pulse 80   Temp (!) 97.5 F (36.4 C) (Temporal)   Ht 5\' 8"  (1.727 m)   Wt 202 lb (91.6 kg)   SpO2 95%   BMI 30.71 kg/m   Gen: No acute distress, resting comfortably MUSCULOSKELETAL: - Left Hand: No deformities.  Tender to palpation at base of first metacarpal.  Pain elicited with wrist lateral and medial motion.  Mild pain with wrist extension as well. Neuro: Grossly normal, moves all extremities Psych: Normal affect and thought content      [name] M. [name], MD 07/11/2023 12:35 PM

## 2023-07-12 LAB — TSH: TSH: 4.8 u[IU]/mL (ref 0.35–5.50)

## 2023-07-16 NOTE — Progress Notes (Signed)
Cholesterol is borderline elevated.  He would benefit from starting a statin to improve his numbers and lower risk of heart attack and stroke.  Please send in Lipitor 20 mg daily if he is agreeable to start.  Regardless, he should continue to work on diet and exercise and we can recheck in a year or so.  The rest of his labs are all stable and we can recheck next year.

## 2023-07-23 ENCOUNTER — Other Ambulatory Visit: Payer: Self-pay | Admitting: *Deleted

## 2023-07-23 MED ORDER — ATORVASTATIN CALCIUM 20 MG PO TABS: 20.0000 mg | ORAL_TABLET | Freq: Every day | ORAL | 3 refills | Status: DC

## 2023-07-25 ENCOUNTER — Ambulatory Visit: Payer: BC Managed Care – PPO | Admitting: Family Medicine

## 2023-07-29 ENCOUNTER — Ambulatory Visit: Payer: BC Managed Care – PPO | Admitting: Family Medicine

## 2023-08-05 ENCOUNTER — Other Ambulatory Visit: Payer: Self-pay | Admitting: Physician Assistant

## 2023-08-06 ENCOUNTER — Other Ambulatory Visit: Payer: Self-pay | Admitting: Family Medicine

## 2023-08-06 NOTE — Telephone Encounter (Signed)
Patient states Pharmacy advised Patient they never received RX for  levothyroxine (SYNTHROID) 50 MCG tablet in August 2024.  Patient requests RX for the above medication be sent asap to Pharmacy listed below.  Also:  Prescription Request  08/06/2023  LOV: 07/11/2023  What is the name of the medication or equipment? meloxicam (MOBIC) 15 MG tablet   Have you contacted your pharmacy to request a refill? Yes   Which pharmacy would you like this sent to?  [id number] - [place], [place] - [address] [facility] [address] [place] [address] Phone: [phone] Fax: [phone]     Patient notified that their request is being sent to the clinical staff for review and that they should receive a response within 2 business days.   Please advise at Mobile [phone] (mobile)

## 2023-08-19 ENCOUNTER — Ambulatory Visit: Payer: BC Managed Care – PPO | Admitting: Family Medicine

## 2023-08-19 ENCOUNTER — Encounter: Payer: Self-pay | Admitting: Family Medicine

## 2023-08-19 VITALS — BP 120/84 | HR 73 | Temp 97.1°F | Ht 68.0 in | Wt 199.2 lb

## 2023-08-19 DIAGNOSIS — Z23 Encounter for immunization: Secondary | ICD-10-CM | POA: Diagnosis not present

## 2023-08-19 DIAGNOSIS — F439 Reaction to severe stress, unspecified: Secondary | ICD-10-CM | POA: Diagnosis not present

## 2023-08-19 DIAGNOSIS — M542 Cervicalgia: Secondary | ICD-10-CM

## 2023-08-19 DIAGNOSIS — E785 Hyperlipidemia, unspecified: Secondary | ICD-10-CM

## 2023-08-19 LAB — LIPID PANEL
Cholesterol: 113 mg/dL (ref 0–200)
HDL: 48.2 mg/dL (ref >39.00)
LDL Cholesterol: 50 mg/dL (ref 0–99)
NonHDL: 64.61
Total CHOL/HDL Ratio: 2
Triglycerides: 74 mg/dL (ref 0.0–149.0)
VLDL: 14.8 mg/dL (ref 0.0–40.0)

## 2023-08-19 MED ORDER — CYCLOBENZAPRINE HCL 10 MG PO TABS: 10.0000 mg | ORAL_TABLET | Freq: Three times a day (TID) | ORAL | 5 refills | Status: DC | PRN

## 2023-08-19 MED ORDER — MELOXICAM 15 MG PO TABS: ORAL_TABLET | ORAL | 5 refills | Status: DC

## 2023-08-19 NOTE — Assessment & Plan Note (Signed)
Is doing much better today than he was a month ago.  He is currently on Tranxene and doxepin.  Doing well with this combination.  Does not need any refills today.  Follow-up in 6 months.

## 2023-08-19 NOTE — Progress Notes (Signed)
   Glenn Kirby is a 62 y.o. male who presents today for an office visit.  Assessment/Plan:  Chronic Problems Addressed Today: Stress Is doing much better today than he was a month ago.  He is currently on Tranxene and doxepin.  Doing well with this combination.  Does not need any refills today.  Follow-up in 6 months.  Dyslipidemia Recently started Lipitor 20 mg daily.  He is doing well with this.  He like to recheck lipids today.  Will place orders.  Neck pain Stable.  Still has occasional flareups.  Will refill his meloxicam and Flexeril.  Does well with these without any significant side effects.  Flu shot given today.     Subjective:  HPI:  See A/P for status of chronic conditions.  Patient is here today for follow-up.  Was last seen here about a month ago.  At that time we had discussed his worsening stress and anxiety.  We restarted his doxepin and continued him on Tranxene.  We also referred him to see a therapist.  At our last visit we also checked labs which were notable for elevated LDL of 121.  We started Lipitor 20 mg daily.  He has been doing well with this however would like to have his labs rechecked.        Objective:  Physical Exam: BP 120/84 (BP Location: Left Arm, Patient Position: Sitting, Cuff Size: Normal)   Pulse 73   Temp (!) 97.1 F (36.2 C) (Temporal)   Ht 5\' 8"  (1.727 m)   Wt 199 lb 4 oz (90.4 kg)   SpO2 94%   BMI 30.30 kg/m   Gen: No acute distress, resting comfortably CV: Regular rate and rhythm with no murmurs appreciated Pulm: Normal work of breathing, clear to auscultation bilaterally with no crackles, wheezes, or rhonchi Neuro: Grossly normal, moves all extremities Psych: Normal affect and thought content      Glenn Kirby M. [name], MD 08/19/2023 8:20 AM

## 2023-08-19 NOTE — Assessment & Plan Note (Signed)
Stable.  Still has occasional flareups.  Will refill his meloxicam and Flexeril.  Does well with these without any significant side effects.

## 2023-08-19 NOTE — Assessment & Plan Note (Signed)
Recently started Lipitor 20 mg daily.  He is doing well with this.  He like to recheck lipids today.  Will place orders.

## 2023-08-19 NOTE — Patient Instructions (Addendum)
It was very nice to see you today!  We will refill your medications.   We will give your flu shot today.   Return in about 6 months (around 02/16/2024) for Follow Up.   Take care, Dr [name]  PLEASE NOTE:  If you had any lab tests, please let us know if you have not heard back within a few days. You may see your results on mychart before we have a chance to review them but we will give you a call once they are reviewed by [place].   If we ordered any referrals today, please let us know if you have not heard from their office within the next week.   If you had any urgent prescriptions sent in today, please check with the pharmacy within an hour of our visit to make sure the prescription was transmitted appropriately.   Please try these tips to maintain a healthy lifestyle:  Eat at least 3 REAL meals and 1-2 snacks per day.  Aim for no more than 5 hours between eating.  If you eat breakfast, please do so within one hour of getting up.   Each meal should contain half fruits/vegetables, one quarter protein, and one quarter carbs (no bigger than a computer mouse)  Cut down on sweet beverages. This includes juice, soda, and sweet tea.   Drink at least 1 glass of water with each meal and aim for at least 8 glasses per day  Exercise at least 150 minutes every week.

## 2023-08-20 NOTE — Progress Notes (Signed)
Cholesterol panel looks much better.  He should continue Lipitor 20 mg daily and we can recheck in 6 to 12 months.

## 2023-11-20 ENCOUNTER — Other Ambulatory Visit: Payer: Self-pay | Admitting: Family Medicine

## 2024-03-13 ENCOUNTER — Ambulatory Visit: Admitting: Family Medicine

## 2024-03-13 ENCOUNTER — Encounter: Payer: Self-pay | Admitting: Family Medicine

## 2024-03-13 VITALS — BP 115/80 | HR 91 | Temp 97.5°F | Ht 68.0 in | Wt 198.4 lb

## 2024-03-13 DIAGNOSIS — G4700 Insomnia, unspecified: Secondary | ICD-10-CM | POA: Diagnosis not present

## 2024-03-13 DIAGNOSIS — F439 Reaction to severe stress, unspecified: Secondary | ICD-10-CM

## 2024-03-13 DIAGNOSIS — E038 Other specified hypothyroidism: Secondary | ICD-10-CM | POA: Diagnosis not present

## 2024-03-13 MED ORDER — CLORAZEPATE DIPOTASSIUM 3.75 MG PO TABS: 3.7500 mg | ORAL_TABLET | Freq: Two times a day (BID) | ORAL | 5 refills | Status: DC | PRN

## 2024-03-13 MED ORDER — ZOLPIDEM TARTRATE 10 MG PO TABS: 10.0000 mg | ORAL_TABLET | Freq: Every evening | ORAL | 1 refills | Status: DC | PRN

## 2024-03-13 NOTE — Patient Instructions (Addendum)
 It was very nice to see you today!  Please try the Ambien.  Let me know in a few weeks how this is working for you.  We will refer you for a sleep study to make sure that you do not have sleep apnea.  I will refer you to see the endocrinologist.  Return if symptoms worsen or fail to improve.   Take care, Dr Daneil Dunker  PLEASE NOTE:  If you had any lab tests, please let us  know if you have not heard back within a few days. You may see your results on mychart before we have a chance to review them but we will give you a call once they are reviewed by us .   If we ordered any referrals today, please let us  know if you have not heard from their office within the next week.   If you had any urgent prescriptions sent in today, please check with the pharmacy within an hour of our visit to make sure the prescription was transmitted appropriately.   Please try these tips to maintain a healthy lifestyle:  Eat at least 3 REAL meals and 1-2 snacks per day.  Aim for no more than 5 hours between eating.  If you eat breakfast, please do so within one hour of getting up.   Each meal should contain half fruits/vegetables, one quarter protein, and one quarter carbs (no bigger than a computer mouse)  Cut down on sweet beverages. This includes juice, soda, and sweet tea.   Drink at least 1 glass of water with each meal and aim for at least 8 glasses per day  Exercise at least 150 minutes every week.

## 2024-03-13 NOTE — Assessment & Plan Note (Signed)
 On Synthroid  50 mcg daily.  He is concerned this may be contributing to his insomnia.  We did discuss rechecking labs today however he declined he is interested in seeing endocrinology for this.  Will place referral today.

## 2024-03-13 NOTE — Assessment & Plan Note (Signed)
 Symptoms have worsened recently with above insomnia issues.  We are treating insomnia as above.  He can continue the Tranxene  twice daily though did discuss that this should not be next with his Ambien.  He can also continue his doxepin  10 mg nightly.

## 2024-03-13 NOTE — Progress Notes (Signed)
   Glenn Kirby is a 63 y.o. male who presents today for an office visit.  Assessment/Plan:  Chronic Problems Addressed Today: Insomnia Not controlled.  He has recently been on doxepin  however this has been less effective recently.  We did discuss alternative options.  He asked about alprazolam.  Recommended against starting this for now due to concern for dependency and side effects.  Additionally he is on Tranxene  as needed for anxiety.  Will try Ambien 10 mg nightly.  He is aware of potential side effects.  He will follow-up with us  in a couple weeks via MyChart.  Also did discuss referral for sleep study.  He was initially hesitant however agreed to further evaluation.  Will place referral today.  He is having several frequent nocturnal awakenings as above with daytime somnolence.  No obvious snoring or other sleep disordered breathing.   Subclinical hypothyroidism On Synthroid  50 mcg daily.  He is concerned this may be contributing to his insomnia.  We did discuss rechecking labs today however he declined he is interested in seeing endocrinology for this.  Will place referral today.  Stress Symptoms have worsened recently with above insomnia issues.  We are treating insomnia as above.  He can continue the Tranxene  twice daily though did discuss that this should not be next with his Ambien.  He can also continue his doxepin  10 mg nightly.     Subjective:  HPI:  See A/P for status of chronic conditions.  Patient is here today with worsening insomnia.  This has been a longstanding issue for him for many years but symptoms are worse in the last couple of months.  He has now waking up nightly around 1230 to 1 AM with anxiety.  Feels like occasional panic attack.  Has significant difficulty with falling back asleep until 3 or 4 AM.  Sometimes will have a sound in his ear like someone is telling him to wake up because he is going to die.  He has been taking clorazepate  for this but ran out about  a week or so ago.  He is interested in trying alternative medications to help with this.  He has little concern for sleep apnea.  Does not think that he snores.       Objective:  Physical Exam: BP 115/80   Pulse 91   Temp (!) 97.5 F (36.4 C) (Temporal)   Ht 5\' 8"  (1.727 m)   Wt 198 lb 6.4 oz (90 kg)   SpO2 97%   BMI 30.17 kg/m   Gen: No acute distress, resting comfortably CV: Regular rate and rhythm with no murmurs appreciated Pulm: Normal work of breathing, clear to auscultation bilaterally with no crackles, wheezes, or rhonchi Neuro: Grossly normal, moves all extremities Psych: Normal affect and thought content      Josemaria Brining M. Daneil Dunker, MD 03/13/2024 11:25 AM

## 2024-03-13 NOTE — Assessment & Plan Note (Signed)
 Not controlled.  He has recently been on doxepin  however this has been less effective recently.  We did discuss alternative options.  He asked about alprazolam.  Recommended against starting this for now due to concern for dependency and side effects.  Additionally he is on Tranxene  as needed for anxiety.  Will try Ambien 10 mg nightly.  He is aware of potential side effects.  He will follow-up with us  in a couple weeks via MyChart.  Also did discuss referral for sleep study.  He was initially hesitant however agreed to further evaluation.  Will place referral today.  He is having several frequent nocturnal awakenings as above with daytime somnolence.  No obvious snoring or other sleep disordered breathing.

## 2024-04-06 ENCOUNTER — Ambulatory Visit: Admitting: Neurology

## 2024-04-06 ENCOUNTER — Encounter: Payer: Self-pay | Admitting: Neurology

## 2024-04-06 VITALS — BP 126/81 | HR 86 | Ht 67.0 in | Wt 200.0 lb

## 2024-04-06 DIAGNOSIS — G4700 Insomnia, unspecified: Secondary | ICD-10-CM

## 2024-04-06 DIAGNOSIS — R351 Nocturia: Secondary | ICD-10-CM

## 2024-04-06 DIAGNOSIS — E66811 Obesity, class 1: Secondary | ICD-10-CM

## 2024-04-06 DIAGNOSIS — R0683 Snoring: Secondary | ICD-10-CM | POA: Diagnosis not present

## 2024-04-06 DIAGNOSIS — R635 Abnormal weight gain: Secondary | ICD-10-CM

## 2024-04-06 DIAGNOSIS — Z9189 Other specified personal risk factors, not elsewhere classified: Secondary | ICD-10-CM | POA: Diagnosis not present

## 2024-04-06 NOTE — Progress Notes (Signed)
 Subjective:    Patient ID: Antonyo Hinderer is a 63 y.o. male.  HPI    Debbra Fairy, MD, PhD [facility] [address] [address] [place], [place] [address]  Dear Dr. Daneil Dunker,   I saw your patient, Taye Cato, upon your kind request in my sleep clinic today for initial consultation of his sleep disorder, in particular, concern for underlying obstructive sleep apnea. The patient is unaccompanied today. As you know, the patient is a 63 year old male with an underlying medical history of hyperlipidemia, subclinical hypothyroidism, neck pain, vertigo and mild obesity, who reports difficulty maintaining sleep for the past several months.  He does have a history of snoring.  Epworth sleepiness score is 2 out of 24, fatigue severity score is 10 out of 63.  He reports that he wakes up with a sense of whispering that he should wake up.  He then has trouble going back to sleep but when he falls back asleep he does dream.  He has nocturia about twice per average night, denies recurrent nocturnal or morning headaches. He has tried doxepin  which did not help.  He has tried Ambien  which did not help.  Sometimes he takes half a pill of Ambien  and his Tranxene  and it seems to help a little bit.  He does not have much in the way of difficulty falling asleep but he may wake up in the early morning hours and stay awake for hours.  He then drifts off to sleep around 4 AM and sleeps till 8 or 8:30 AM.  He generally tries to go to bed around 10 or 10:30 PM.  He is single and lives alone, no children, he has 7 cats in the household and they typically sleep in the bedroom with him.  He does not have a TV in the bedroom.  He drinks caffeine in the form of coffee and tea, 1 serving each typically.  He is a non-smoker and does not drink any alcohol.  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  He is not aware of any family history of sleep apnea.  He has had slow weight gain over the past 20 years.     I reviewed your office note from 03/13/24.   His Past Medical History Is Significant For: History reviewed. No pertinent past medical history.  His Past Surgical History Is Significant For: Past Surgical History:  Procedure Laterality Date   LEFT HEART CATHETERIZATION WITH CORONARY ANGIOGRAM N/A 05/12/2013   Procedure: LEFT HEART CATHETERIZATION WITH CORONARY ANGIOGRAM;  Surgeon: Sharene Dauer, MD;  Location: [facility] CATH LAB;  Service: Cardiovascular;  Laterality: N/A;    His Family History Is Significant For: Family History  Problem Relation Age of Onset   Cancer Neg Hx    Sleep apnea Neg Hx     His Social History Is Significant For: Social History   Socioeconomic History   Marital status: Married    Spouse name: Not on file   Number of children: Not on file   Years of education: Not on file   Highest education level: Not on file  Occupational History   Not on file  Tobacco Use   Smoking status: Never   Smokeless tobacco: Never  Vaping Use   Vaping status: Never Used  Substance and Sexual Activity   Alcohol use: No   Drug use: Never   Sexual activity: Not on file  Other Topics Concern   Not on file  Social History Narrative  Pt lives alone    Retired    [employer] Strain: Not on [employer] Insecurity: Not on file  Transportation Needs: Not on file  Physical Activity: Not on file  Stress: Not on file  Social Connections: Not on file    His Allergies Are:  No Known Allergies:   His Current Medications Are:  Outpatient Encounter Medications as of 04/06/2024  Medication Sig   atorvastatin  (LIPITOR) 20 MG tablet TAKE 1 TABLET(20 MG) BY MOUTH DAILY   clorazepate  (TRANXENE ) 3.75 MG tablet Take 1 tablet (3.75 mg total) by mouth 2 (two) times daily as needed for anxiety. Do not take with ambien    cyclobenzaprine  (FLEXERIL ) 10 MG tablet Take 1 tablet (10 mg total) by mouth 3 (three) times daily as needed for muscle spasms.    doxepin  (SINEQUAN ) 10 MG capsule Take 1 capsule (10 mg total) by mouth at bedtime.   levothyroxine  (SYNTHROID ) 50 MCG tablet Take 1 tablet (50 mcg total) by mouth daily.   meclizine  (ANTIVERT ) 25 MG tablet TAKE 1 TABLET(25 MG) BY MOUTH THREE TIMES DAILY AS NEEDED FOR DIZZINESS   meloxicam  (MOBIC ) 15 MG tablet TAKE 1 TABLET(15 MG) BY MOUTH DAILY   zolpidem  (AMBIEN ) 10 MG tablet Take 1 tablet (10 mg total) by mouth at bedtime as needed for sleep.   No facility-administered encounter medications on file as of 04/06/2024.  :   Review of Systems:  Out of a complete 14 point review of systems, all are reviewed and negative with the exception of these symptoms as listed below:  Review of Systems  Neurological:        Pt here for sleep consult  Pt snores,fatigue Pt denies hypertension,sleep study,cpap machine,headaches    ESS:2 FSS:10     Objective:  Neurological Exam  Physical Exam Physical Examination:   Vitals:   04/06/24 1330  BP: 126/81  Pulse: 86    General Examination: The patient is a very pleasant 63 y.o. male in no acute distress. He appears well-developed and well-nourished and well groomed.   HEENT: Normocephalic, atraumatic, pupils are equal, round and reactive to light, extraocular tracking is good without limitation to gaze excursion or nystagmus noted. Hearing is grossly intact. Face is symmetric with normal facial animation. Speech is clear with no dysarthria noted. There is no hypophonia. There is no lip, neck/head, jaw or voice tremor. Neck is supple with full range of passive and active motion. There are no carotid bruits on auscultation. Oropharynx exam reveals: mild mouth dryness, adequate dental hygiene and mild airway crowding, due to small airway entry, tonsils on the smaller side, Mallampati class II.  Neck circumference 18 three-quarter inches, minimal overbite noted.  Tongue protrudes centrally and palate elevates symmetrically.  Abdomen: Soft, non-tender and  non-distended.  Extremities: There is no obvious swelling in the distal lower extremities bilaterally.   Skin: Warm and dry without trophic changes noted.   Musculoskeletal: exam reveals no obvious joint deformities.   Neurologically:  Mental status: The patient is awake, alert and oriented in all 4 spheres. His immediate and remote memory, attention, language skills and fund of knowledge are appropriate. There is no evidence of aphasia, agnosia, apraxia or anomia. Speech is clear with normal prosody and enunciation. Thought process is linear. Mood is normal and affect is normal.  Cranial nerves II - XII are as described above under HEENT exam.  Motor exam: Normal bulk, strength and tone is noted. There is no obvious  action or resting tremor.  Fine motor skills and coordination: grossly intact.  Cerebellar testing: No dysmetria or intention tremor. There is no truncal or gait ataxia.  Sensory exam: intact to light touch in the upper and lower extremities.  Gait, station and balance: He stands easily. No veering to one side is noted. No leaning to one side is noted. Posture is age-appropriate and stance is narrow based. Gait shows normal stride length and normal pace. No problems turning are noted.   Assessment and Plan:  In summary, Harold Pagliarulo is a very pleasant 63 y.o.-year old male with an underlying medical history of hyperlipidemia, subclinical hypothyroidism, neck pain, vertigo and mild obesity, whose history and physical exam are concerning for sleep disordered breathing, particularly obstructive sleep apnea (OSA). While a laboratory attended sleep study is typically considered "gold standard" for evaluation of sleep disordered breathing, the patient would prefer a home sleep test at this time.   I had a long chat with the patient about my findings and the diagnosis of sleep apnea, particularly OSA, its prognosis and treatment options. We talked about medical/conservative treatments,  surgical interventions and non-pharmacological approaches for symptom control. I explained, in particular, the risks and ramifications of untreated moderate to severe OSA, especially with respect to developing cardiovascular disease down the road, including congestive heart failure (CHF), difficult to treat hypertension, cardiac arrhythmias (particularly A-fib), neurovascular complications including TIA, stroke and dementia. Even type 2 diabetes has, in part, been linked to untreated OSA. Symptoms of untreated OSA may include (but may not be limited to) daytime sleepiness, nocturia (i.e. frequent nighttime urination), memory problems, mood irritability and suboptimally controlled or worsening mood disorder such as depression and/or anxiety, lack of energy, lack of motivation, physical discomfort, as well as recurrent headaches, especially morning or nocturnal headaches. We talked about the importance of maintaining a healthy lifestyle and striving for healthy weight. In addition, we talked about the importance of striving for and maintaining good sleep hygiene. I recommended a sleep study at this time. I outlined the differences between a laboratory attended sleep study which is considered more comprehensive and accurate over the option of a home sleep test (HST); the latter may lead to underestimation of sleep disordered breathing in some instances and does not help with diagnosing upper airway resistance syndrome and is not accurate enough to diagnose primary central sleep apnea typically. I outlined possible surgical and non-surgical treatment options of OSA, including the use of a positive airway pressure (PAP) device (i.e. CPAP, AutoPAP/APAP or BiPAP in certain circumstances), a custom-made dental device (aka oral appliance, which would require a referral to a specialist dentist or orthodontist typically, and is generally speaking not considered for patients with full dentures or edentulous state), upper  airway surgical options, such as traditional UPPP (which is not considered a first-line treatment) or the Inspire device (hypoglossal nerve stimulator, which would involve a referral for consultation with an ENT surgeon, after careful selection, following inclusion criteria - also not first-line treatment). I explained the PAP treatment option to the patient in detail, as this is generally considered first-line treatment.  The patient indicated that he would be willing to try PAP therapy, if the need arises. I explained the importance of being compliant with PAP treatment, not only for insurance purposes but primarily to improve patient's symptoms symptoms, and for the patient's long term health benefit, including to reduce His cardiovascular risks longer-term.    We will pick up our discussion about the next steps and  treatment options after testing.  We will keep him posted as to the test results by phone call and/or MyChart messaging where possible.  We will plan to follow-up in sleep clinic accordingly as well.  I answered all his questions today and the patient was in agreement.   I encouraged him to call with any interim questions, concerns, problems or updates or email us  through MyChart.  Generally speaking, sleep test authorizations may take up to 2 weeks, sometimes less, sometimes longer, the patient is encouraged to get in touch with us  if they do not hear back from the sleep lab staff directly within the next 2 weeks.  Thank you very much for allowing me to participate in the care of this nice patient. If I can be of any further assistance to you please do not hesitate to call me at [phone].  Sincerely,   Debbra Fairy, MD, PhD

## 2024-04-06 NOTE — Patient Instructions (Signed)

## 2024-04-08 ENCOUNTER — Other Ambulatory Visit: Payer: Self-pay | Admitting: *Deleted

## 2024-04-08 MED ORDER — ATORVASTATIN CALCIUM 20 MG PO TABS: 20.0000 mg | ORAL_TABLET | Freq: Every day | ORAL | 1 refills | Status: DC

## 2024-06-25 ENCOUNTER — Other Ambulatory Visit: Payer: Self-pay | Admitting: *Deleted

## 2024-06-25 MED ORDER — DOXEPIN HCL 10 MG PO CAPS: 10.0000 mg | ORAL_CAPSULE | Freq: Every day | ORAL | 3 refills | Status: AC

## 2024-06-29 ENCOUNTER — Encounter: Payer: Self-pay | Admitting: Family Medicine

## 2024-06-30 ENCOUNTER — Other Ambulatory Visit: Payer: Self-pay | Admitting: *Deleted

## 2024-06-30 MED ORDER — MELOXICAM 15 MG PO TABS: ORAL_TABLET | ORAL | 1 refills | Status: DC

## 2024-07-15 ENCOUNTER — Other Ambulatory Visit: Payer: Self-pay | Admitting: *Deleted

## 2024-07-15 MED ORDER — LEVOTHYROXINE SODIUM 50 MCG PO TABS: 50.0000 ug | ORAL_TABLET | Freq: Every day | ORAL | 3 refills | Status: AC

## 2024-08-24 ENCOUNTER — Other Ambulatory Visit: Payer: Self-pay | Admitting: Family Medicine

## 2024-09-03 ENCOUNTER — Ambulatory Visit: Admitting: Family Medicine

## 2024-09-03 ENCOUNTER — Encounter: Payer: Self-pay | Admitting: Family Medicine

## 2024-09-03 VITALS — BP 120/80 | HR 95 | Temp 98.1°F | Ht 67.0 in | Wt 201.0 lb

## 2024-09-03 DIAGNOSIS — M545 Low back pain, unspecified: Secondary | ICD-10-CM | POA: Diagnosis not present

## 2024-09-03 DIAGNOSIS — G4700 Insomnia, unspecified: Secondary | ICD-10-CM

## 2024-09-03 MED ORDER — PREDNISONE 50 MG PO TABS: ORAL_TABLET | ORAL | 0 refills | Status: AC

## 2024-09-03 MED ORDER — CYCLOBENZAPRINE HCL 10 MG PO TABS: 10.0000 mg | ORAL_TABLET | Freq: Three times a day (TID) | ORAL | 5 refills | Status: AC | PRN

## 2024-09-03 MED ORDER — GABAPENTIN 300 MG PO CAPS: 300.0000 mg | ORAL_CAPSULE | Freq: Every day | ORAL | 3 refills | Status: DC

## 2024-09-03 MED ORDER — CLORAZEPATE DIPOTASSIUM 3.75 MG PO TABS: 3.7500 mg | ORAL_TABLET | Freq: Two times a day (BID) | ORAL | 5 refills | Status: AC | PRN

## 2024-09-03 NOTE — Patient Instructions (Signed)
 It was very nice to see you today!  VISIT SUMMARY: Today, we addressed your persistent lower back pain radiating to your leg, which has been ongoing for six months and has recently worsened. We also discussed your difficulties with sleep and increased anxiety due to the pain.  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 This is likely due to a pinched sciatic nerve. -Start taking prednisone for its anti-inflammatory effect. -Start taking gabapentin to help manage nerve pain. -Resume taking cyclobenzaprine  as a muscle relaxant. -Get a lumbar spine x-ray at the [facility] office. -Use a heating pad several times daily to help relieve pain.  INSOMNIA: Your difficulty sleeping is being worsened by your sciatica pain. -Continue taking Doxepin  10 mg nightly.  ANXIETY DISORDER: Your anxiety has increased due to the pain from sciatica. -Continue taking Tranxene  as needed. -Refill your prescription for chlorazepate.  Return if symptoms worsen or fail to improve.   Take care, Dr Kennyth  PLEASE NOTE:  If you had any lab tests, please let us  know if you have not heard back within a few days. You may see your results on mychart before we have a chance to review them but we will give you a call once they are reviewed by us .   If we ordered any referrals today, please let us  know if you have not heard from their office within the next week.   If you had any urgent prescriptions sent in today, please check with the pharmacy within an hour of our visit to make sure the prescription was transmitted appropriately.   Please try these tips to maintain a healthy lifestyle:  Eat at least 3 REAL meals and 1-2 snacks per day.  Aim for no more than 5 hours between eating.  If you eat breakfast, please do so within one hour of getting up.   Each meal should contain half fruits/vegetables, one quarter protein, and one quarter  carbs (no bigger than a computer mouse)  Cut down on sweet beverages. This includes juice, soda, and sweet tea.   Drink at least 1 glass of water with each meal and aim for at least 8 glasses per day  Exercise at least 150 minutes every week.

## 2024-09-03 NOTE — Assessment & Plan Note (Signed)
 Patient with intermittent left lower back pain for the last few months as well as sciatica symptoms.  Overall reassuring exam without any red flags.  Given the length of symptoms we will check plain film today to further evaluate.  Also start prednisone burst 50 mg daily for 5 days and gabapentin 300 mg nightly.  Will refill Flexeril  which she has done well within the past.  Discussed referral to physical therapy however he declined.  He will let us  know if not improving in the next few weeks and would consider referral at that time.  We discussed reasons to return to care.

## 2024-09-03 NOTE — Assessment & Plan Note (Signed)
 symptoms have worsened recently due to his above leg pain however overall stable on Tranxene  3.75 mg nightly as needed.  Will refill today.  Tolerating well.  No significant side effects.

## 2024-09-03 NOTE — Progress Notes (Signed)
   Glenn Kirby is a 63 y.o. male who presents today for an office visit.  Assessment/Plan:  Chronic Problems Addressed Today: Low back pain Patient with intermittent left lower back pain for the last few months as well as sciatica symptoms.  Overall reassuring exam without any red flags.  Given the length of symptoms we will check plain film today to further evaluate.  Also start prednisone burst 50 mg daily for 5 days and gabapentin 300 mg nightly.  Will refill Flexeril  which she has done well within the past.  Discussed referral to physical therapy however he declined.  He will let us  know if not improving in the next few weeks and would consider referral at that time.  We discussed reasons to return to care.  Insomnia  symptoms have worsened recently due to his above leg pain however overall stable on Tranxene  3.75 mg nightly as needed.  Will refill today.  Tolerating well.  No significant side effects.     Subjective:  HPI:  See assessment / plan for status of chronic conditions.      Discussed the use of AI scribe software for clinical note transcription with the patient, who gave verbal consent to proceed.  History of Present Illness Glenn Kirby is a 63 year old male who presents with persistent lower back pain radiating to the leg.  He has been experiencing lower back pain for the past six months, initially intermittent but now continuous over the last week. The pain is located in the lower back near the buttocks and radiates down the leg, especially at night. He describes it as an 'electric shot'.  He has been using Vicks for relief, which helps, and has tried ibuprofen and Tylenol . He is currently taking meloxicam  most days without significant relief. He has difficulty sleeping due to the pain and feels nervous about the condition.  No urinary symptoms, falls, or injuries that could have triggered the pain. No specific activities exacerbate the pain. He has a history of  using Flexeril  (cyclobenzaprine ) but is not currently taking it. He mentions being low on Chlorazepate, which he uses.         Objective:  Physical Exam: BP 120/80   Pulse 95   Temp 98.1 F (36.7 C) (Temporal)   Ht 5' 7 (1.702 m)   Wt 201 lb (91.2 kg)   SpO2 97%   BMI 31.48 kg/m   Gen: No acute distress, resting comfortably MUSCULOSKELETAL: - Low Back: No deformities.  Nontender to palpation - Legs: No deformities.  Straight leg raise positive on the left.  Neurovascular intact distally.  Strength and sensation light touch intact throughout. Neuro: Grossly normal, moves all extremities Psych: Normal affect and thought content      Galan Ghee M. Kennyth, MD 09/03/2024 12:29 PM

## 2024-09-07 ENCOUNTER — Encounter: Payer: Self-pay | Admitting: Family Medicine

## 2024-09-08 ENCOUNTER — Other Ambulatory Visit: Payer: Self-pay | Admitting: *Deleted

## 2024-09-08 MED ORDER — METHOCARBAMOL 500 MG PO TABS: 500.0000 mg | ORAL_TABLET | Freq: Three times a day (TID) | ORAL | 0 refills | Status: AC | PRN

## 2024-09-08 NOTE — Telephone Encounter (Signed)
 It is okay to send in methocarbamol 500 mg 3 times daily as needed.  I am not sure about the copayment question - he can check with insurance or the billing department.

## 2024-09-08 NOTE — Telephone Encounter (Signed)
**Note De-identified  [place] Obfuscation** Please advise 

## 2024-09-22 NOTE — Telephone Encounter (Signed)
 Will forward to Tammy.

## 2024-09-29 ENCOUNTER — Other Ambulatory Visit: Payer: Self-pay | Admitting: Family Medicine

## 2024-09-29 NOTE — Telephone Encounter (Signed)
 I have heard back from billing and coding.  Claim has been resubmitted to insurance.  Once EOB is posted, the patient will see a $15.00 credit back to their account.   I have left patient a vm in regard.  I have informed once all pending claims have been processed through insurance a check can be cut.

## 2024-10-18 ENCOUNTER — Other Ambulatory Visit: Payer: Self-pay | Admitting: Family Medicine

## 2024-12-11 ENCOUNTER — Other Ambulatory Visit: Payer: Self-pay | Admitting: Family Medicine

## 2024-12-11 ENCOUNTER — Encounter: Payer: Self-pay | Admitting: Family Medicine

## 2024-12-12 ENCOUNTER — Other Ambulatory Visit: Payer: Self-pay | Admitting: Family Medicine

## 2024-12-14 NOTE — Telephone Encounter (Signed)
 Rx was send on 12/11/2024 to [facility] pharmacy
